# Patient Record
Sex: Female | Born: 1940 | Race: White | Hispanic: No | Marital: Married | Smoking: Never smoker
Health system: Southern US, Community
[De-identification: ages and names within clinical notes are randomized; demographics above are authoritative.]

## PROBLEM LIST (undated history)

## (undated) DIAGNOSIS — N289 Disorder of kidney and ureter, unspecified: Secondary | ICD-10-CM

## (undated) DIAGNOSIS — IMO0001 Reserved for inherently not codable concepts without codable children: Secondary | ICD-10-CM

## (undated) DIAGNOSIS — I10 Essential (primary) hypertension: Secondary | ICD-10-CM

## (undated) DIAGNOSIS — I517 Cardiomegaly: Secondary | ICD-10-CM

## (undated) DIAGNOSIS — R011 Cardiac murmur, unspecified: Secondary | ICD-10-CM

## (undated) DIAGNOSIS — I422 Other hypertrophic cardiomyopathy: Secondary | ICD-10-CM

## (undated) DIAGNOSIS — Z853 Personal history of malignant neoplasm of breast: Secondary | ICD-10-CM

## (undated) DIAGNOSIS — M1990 Unspecified osteoarthritis, unspecified site: Secondary | ICD-10-CM

## (undated) DIAGNOSIS — Z9889 Other specified postprocedural states: Secondary | ICD-10-CM

## (undated) DIAGNOSIS — R112 Nausea with vomiting, unspecified: Secondary | ICD-10-CM

## (undated) DIAGNOSIS — Z9289 Personal history of other medical treatment: Secondary | ICD-10-CM

## (undated) DIAGNOSIS — Z86711 Personal history of pulmonary embolism: Secondary | ICD-10-CM

## (undated) DIAGNOSIS — R079 Chest pain, unspecified: Secondary | ICD-10-CM

## (undated) DIAGNOSIS — R232 Flushing: Secondary | ICD-10-CM

## (undated) DIAGNOSIS — C801 Malignant (primary) neoplasm, unspecified: Secondary | ICD-10-CM

## (undated) HISTORY — DX: Personal history of malignant neoplasm of breast: Z85.3

## (undated) HISTORY — PX: OTHER SURGICAL HISTORY: SHX169

## (undated) HISTORY — PX: EYE SURGERY: SHX253

## (undated) HISTORY — DX: Cardiac murmur, unspecified: R01.1

## (undated) HISTORY — PX: MASTECTOMY: SHX3

## (undated) HISTORY — PX: TUBAL LIGATION: SHX77

## (undated) HISTORY — DX: Disorder of kidney and ureter, unspecified: N28.9

## (undated) HISTORY — DX: Chest pain, unspecified: R07.9

## (undated) HISTORY — PX: ROTATOR CUFF REPAIR: SHX139

## (undated) HISTORY — DX: Cardiomegaly: I51.7

## (undated) HISTORY — DX: Flushing: R23.2

## (undated) HISTORY — DX: Other hypertrophic cardiomyopathy: I42.2

## (undated) HISTORY — DX: Essential (primary) hypertension: I10

---

## 1998-10-18 ENCOUNTER — Encounter: Payer: Self-pay | Admitting: Obstetrics and Gynecology

## 1998-10-18 ENCOUNTER — Ambulatory Visit (HOSPITAL_COMMUNITY): Admission: RE | Admit: 1998-10-18 | Discharge: 1998-10-18 | Payer: Self-pay | Admitting: Obstetrics and Gynecology

## 1999-10-21 ENCOUNTER — Ambulatory Visit (HOSPITAL_COMMUNITY): Admission: RE | Admit: 1999-10-21 | Discharge: 1999-10-21 | Payer: Self-pay | Admitting: Obstetrics and Gynecology

## 1999-10-21 ENCOUNTER — Encounter: Payer: Self-pay | Admitting: Obstetrics and Gynecology

## 2000-03-24 ENCOUNTER — Encounter (INDEPENDENT_AMBULATORY_CARE_PROVIDER_SITE_OTHER): Payer: Self-pay | Admitting: Specialist

## 2000-03-24 ENCOUNTER — Other Ambulatory Visit: Admission: RE | Admit: 2000-03-24 | Discharge: 2000-03-24 | Payer: Self-pay | Admitting: Obstetrics and Gynecology

## 2000-03-30 ENCOUNTER — Encounter: Payer: Self-pay | Admitting: Family Medicine

## 2000-03-30 ENCOUNTER — Ambulatory Visit (HOSPITAL_COMMUNITY): Admission: RE | Admit: 2000-03-30 | Discharge: 2000-03-30 | Payer: Self-pay | Admitting: Family Medicine

## 2000-07-23 ENCOUNTER — Ambulatory Visit (HOSPITAL_COMMUNITY): Admission: RE | Admit: 2000-07-23 | Discharge: 2000-07-23 | Payer: Self-pay | Admitting: Obstetrics and Gynecology

## 2000-07-23 ENCOUNTER — Encounter (INDEPENDENT_AMBULATORY_CARE_PROVIDER_SITE_OTHER): Payer: Self-pay | Admitting: Specialist

## 2000-12-01 ENCOUNTER — Encounter: Payer: Self-pay | Admitting: Obstetrics and Gynecology

## 2000-12-01 ENCOUNTER — Ambulatory Visit (HOSPITAL_COMMUNITY): Admission: RE | Admit: 2000-12-01 | Discharge: 2000-12-01 | Payer: Self-pay | Admitting: Obstetrics and Gynecology

## 2001-12-02 ENCOUNTER — Encounter: Payer: Self-pay | Admitting: Obstetrics and Gynecology

## 2001-12-02 ENCOUNTER — Ambulatory Visit (HOSPITAL_COMMUNITY): Admission: RE | Admit: 2001-12-02 | Discharge: 2001-12-02 | Payer: Self-pay | Admitting: Obstetrics and Gynecology

## 2002-12-05 ENCOUNTER — Encounter: Payer: Self-pay | Admitting: Obstetrics and Gynecology

## 2002-12-05 ENCOUNTER — Ambulatory Visit (HOSPITAL_COMMUNITY): Admission: RE | Admit: 2002-12-05 | Discharge: 2002-12-05 | Payer: Self-pay | Admitting: Obstetrics and Gynecology

## 2003-12-06 ENCOUNTER — Ambulatory Visit (HOSPITAL_COMMUNITY): Admission: RE | Admit: 2003-12-06 | Discharge: 2003-12-06 | Payer: Self-pay | Admitting: Obstetrics and Gynecology

## 2006-07-14 ENCOUNTER — Encounter (INDEPENDENT_AMBULATORY_CARE_PROVIDER_SITE_OTHER): Payer: Self-pay | Admitting: Obstetrics and Gynecology

## 2007-09-09 IMAGING — US MAMMO-RUNI-US
1 series · 13 of 13 positions shown · non-contrast
Comparison: NONE

CLINICAL DATA: Mirek Arechiga, RT(R)(M)   Diagnostic 
Mammogram.   Lump noted in the right breast that was felt by the 
patient.  

RIGHT BREAST MAMMOGRAM AND RIGHT BREAST ULTRASOUND

[Series 1: us right breast · 0.06mm/px · 13 of 13 slices shown]
[im 1/13]
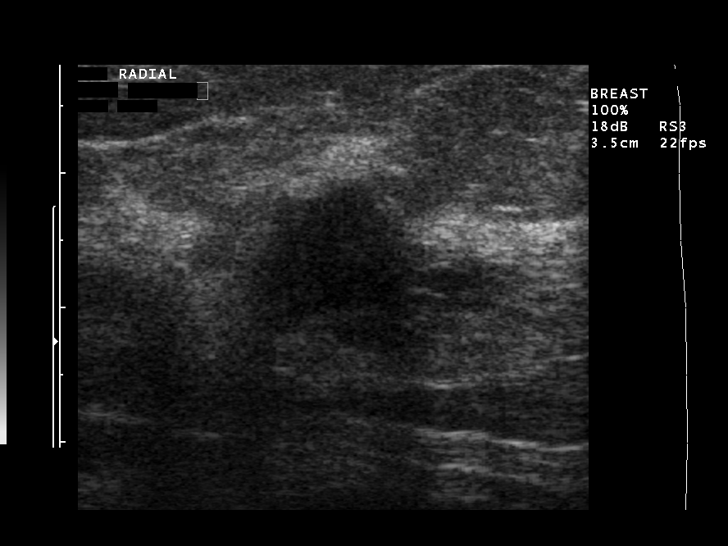
[im 2/13]
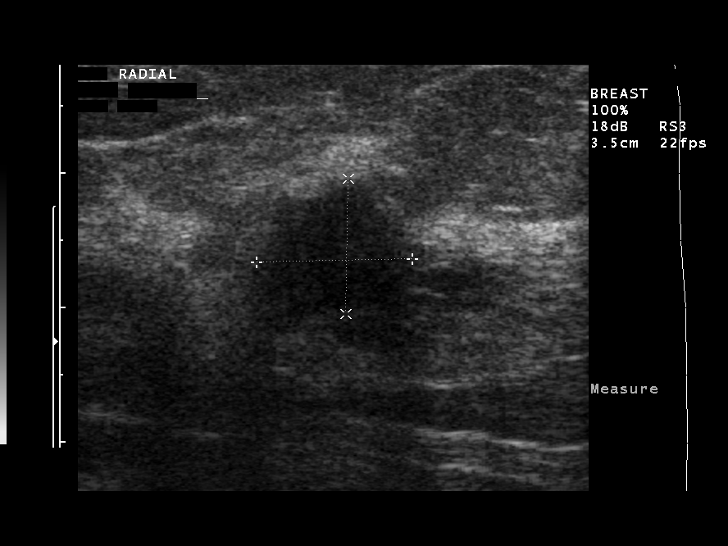
[im 3/13]
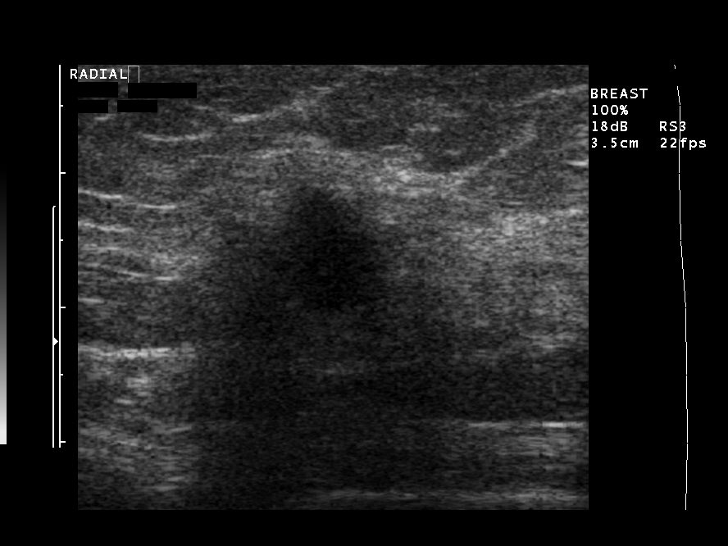
[im 4/13]
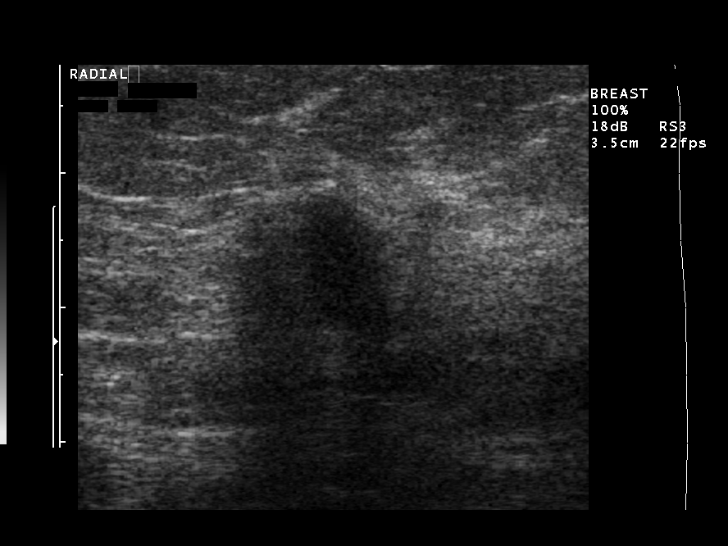
[im 5/13]
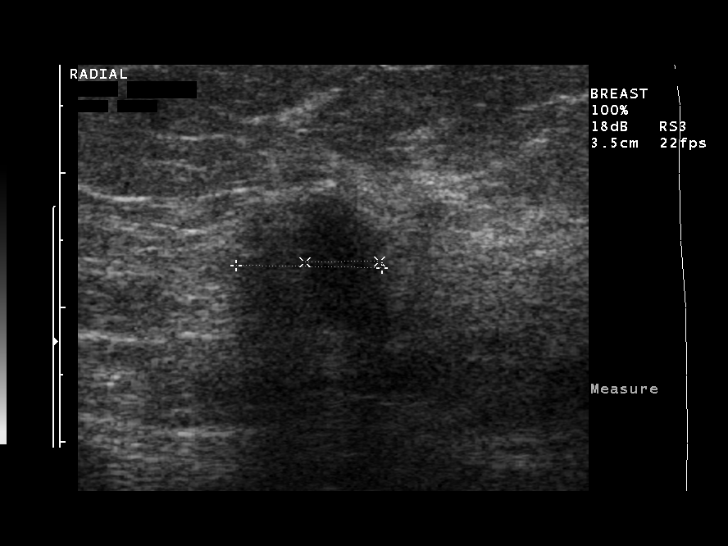
[im 6/13]
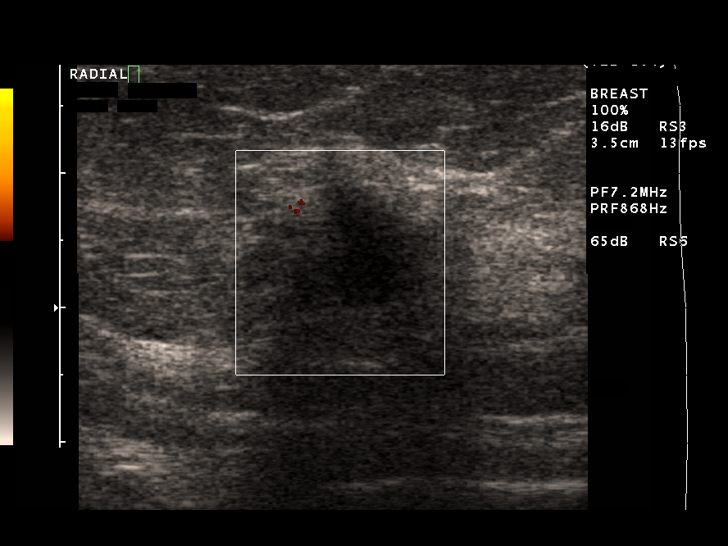
[im 7/13]
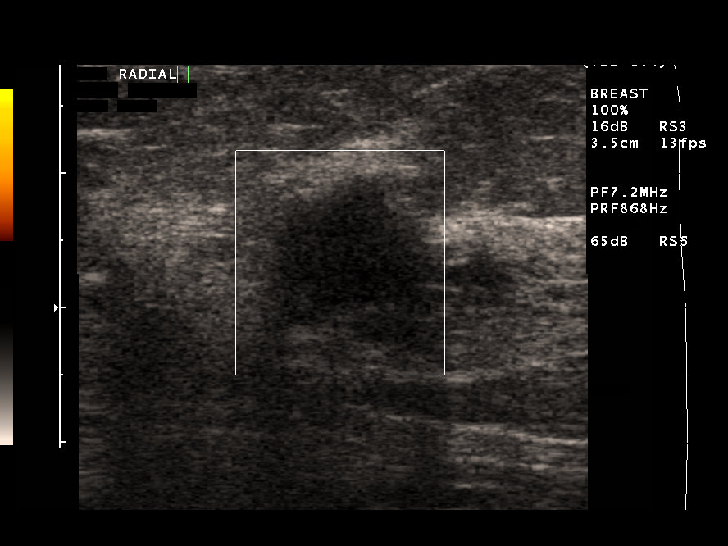
[im 8/13]
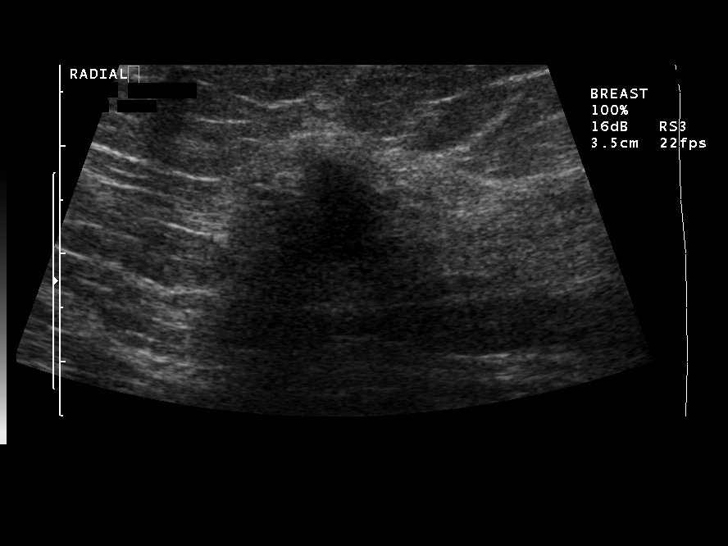
[im 9/13]
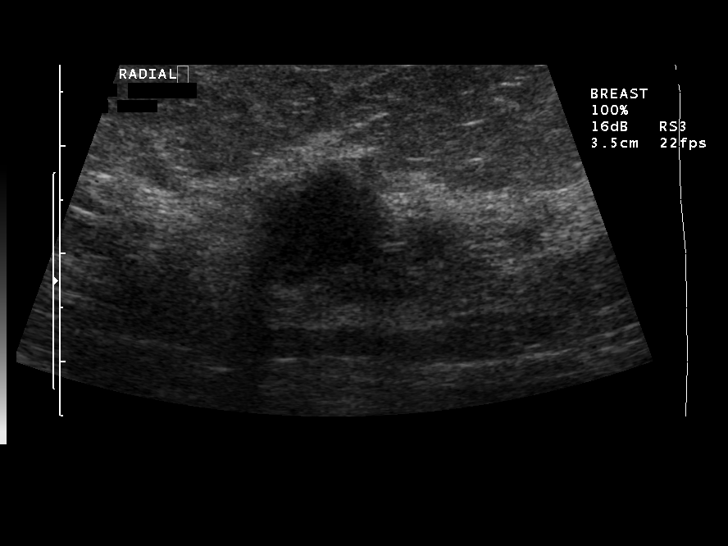
[im 10/13]
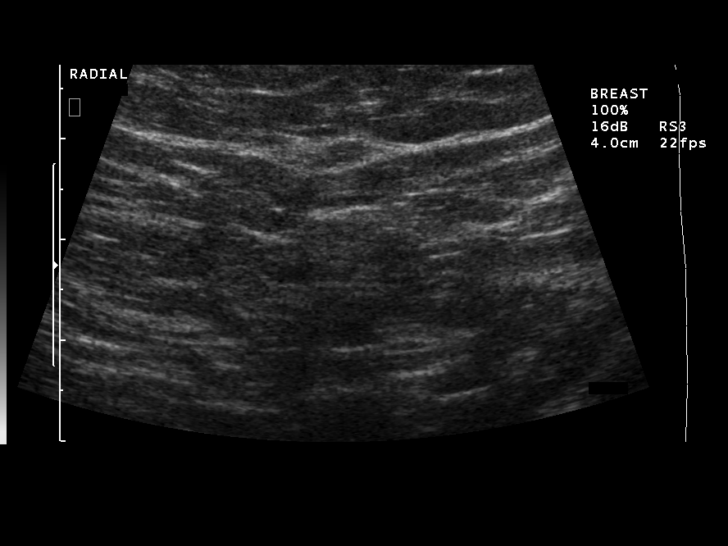
[im 11/13]
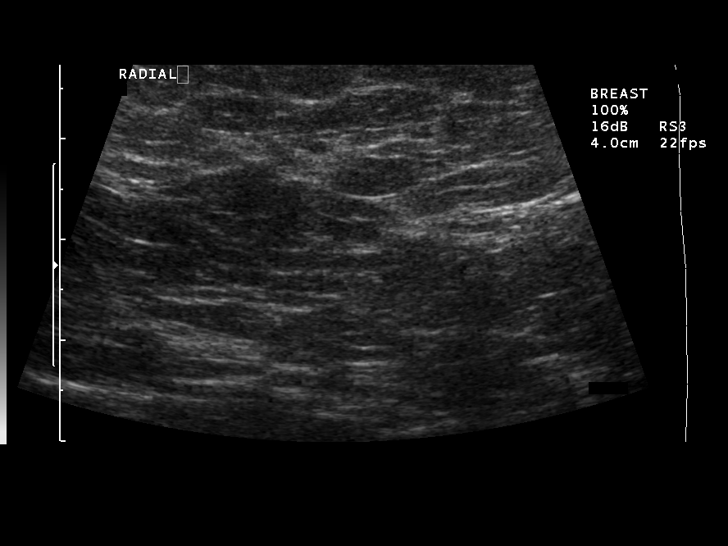
[im 12/13]
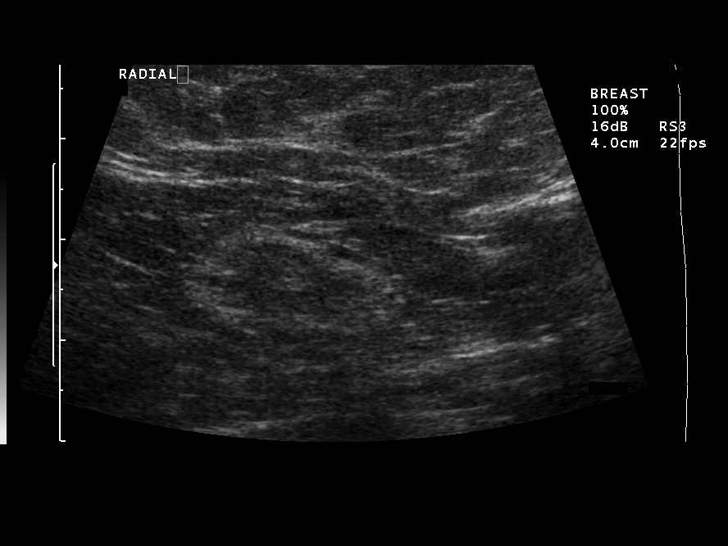
[im 13/13]
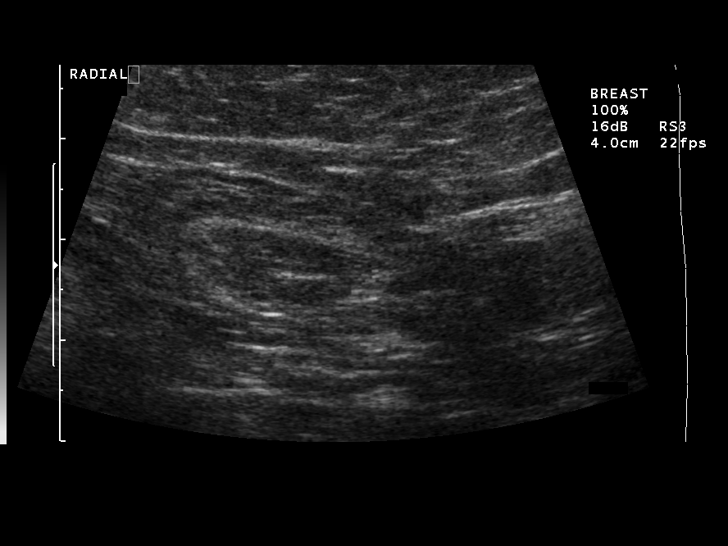

[13 of 13 positions shown; findings below may reference images not displayed]

FINDINGS: There is moderately dense breast parenchyma.  There is 
a prior mammogram dated 12-19-04 and 12-30-05 for comparison.  
There is interval development of pleomorphic cluster of 
microcalcifications in the upper outer quadrant of the breast near 
the area where the metallic marker is placed at the site of the 
lump.  No dominant mass is seen.  There are other unchanged 
calcifications noted in the right breast. At the area of the lump 
in the right breast at the [DATE] position 7 cm from the nipple, 
there is a hypoechoic mass with an irregular ill-defined margin.  
This measures approximately 12 x 10 x 11 mm.  There is associated 
acoustic shadowing.  The right axilla was evaluated by ultrasound 
and there was no evidence of an abnormal lymph node in the axilla.
IMPRESSION: Hypoechoic solid mass in the [DATE] position of the 
right breast, 7 cm from the nipple seen on ultrasound.  I would 
recommend a large core biopsy of the mass. No abnormal right 
axillary lymph nodes. Pleomorphic calcifications are seen and are 
new in the upper outer quadrant of the breast.  These are near the 
area where the patient palpates a mass.  No dominant associated 
mass could be seen on mammography. I recommend large core biopsy 
of the hypoechoic mass seen in the right breast.  I would 
recommend that a radiopaque marker be placed at the biopsy site 
and follow-up two view mammography be obtained to determine the 
relationship of the mass to the calcifications. The patient has 
been scheduled for biopsy on 07/14/06 at [DATE] a.m. The patient 
was informed at the time of the examination of the findings and 
recommendations by verbal and written lay report. Computer 
assisted (Second Look) technology was used as an aid in 
interpretation of this study. BI-RADS 5:  Highly suggestive of 
malignancy. Tiger, Abimelk electronically reviewed on 
07/10/2006 Dict Date: 07/08/2006  Tran Date: 07/10/2006 THINGAHANGWI OZA

## 2009-10-09 ENCOUNTER — Ambulatory Visit: Payer: Self-pay | Admitting: Cardiovascular Disease

## 2010-04-24 ENCOUNTER — Ambulatory Visit (INDEPENDENT_AMBULATORY_CARE_PROVIDER_SITE_OTHER): Payer: Medicare Other | Admitting: Cardiovascular Disease

## 2010-04-24 DIAGNOSIS — I119 Hypertensive heart disease without heart failure: Secondary | ICD-10-CM

## 2010-06-28 NOTE — Op Note (Signed)
[facility]  Patient:    Sarah Williamson, Sarah Williamson                        MRN: [id number] Proc. Date: 07/23/00 Adm. Date:  [id number] Attending:  Lendon Colonel                           Operative Report  PREOPERATIVE DIAGNOSIS:  Postmenopausal bleeding, persistent.  POSTOPERATIVE DIAGNOSES:  Postmenopausal bleeding, persistent, submucous fibroid.  OPERATION:  Hysteroscopy with resection of submucous fibroid.  DESCRIPTION OF PROCEDURE:  The patient was placed in lithotomy position and prepped and draped in the usual fashion.  Cervix dilated.  Endometrial cavity was visualized.  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  The remaining portion of the uterus was resected as well.  Coagulation was obtained for hemostasis as well.  No unusual blood loss occurred.  All of the resection was sent to the lab for study.  Ms. Olivia Mackie tolerated the procedure well and was sent to the recovery room in good condition. DD:  07/23/00 TD:  07/23/00 Job: [id number]

## 2010-07-19 ENCOUNTER — Other Ambulatory Visit: Payer: Self-pay | Admitting: Cardiovascular Disease

## 2010-07-22 ENCOUNTER — Other Ambulatory Visit: Payer: Self-pay | Admitting: *Deleted

## 2010-07-22 MED ORDER — HYDROCHLOROTHIAZIDE 25 MG PO TABS: 25.0000 mg | ORAL_TABLET | Freq: Every day | ORAL | Status: DC

## 2010-07-22 NOTE — Telephone Encounter (Signed)
Fax received from pharmacy. Refill completed. Jodette Razan Siler RN  

## 2011-01-17 ENCOUNTER — Other Ambulatory Visit: Payer: Self-pay | Admitting: Cardiovascular Disease

## 2011-01-17 NOTE — Telephone Encounter (Signed)
Fax Received. Refill Completed. Sarah Williamson (R.M.A)   

## 2011-03-03 ENCOUNTER — Other Ambulatory Visit: Payer: Self-pay | Admitting: *Deleted

## 2011-03-03 MED ORDER — HYDROCHLOROTHIAZIDE 25 MG PO TABS: 25.0000 mg | ORAL_TABLET | Freq: Every day | ORAL | Status: DC

## 2011-03-03 MED ORDER — METOPROLOL SUCCINATE ER 50 MG PO TB24: 50.0000 mg | ORAL_TABLET | Freq: Every day | ORAL | Status: DC

## 2011-03-10 ENCOUNTER — Other Ambulatory Visit: Payer: Self-pay | Admitting: *Deleted

## 2011-07-06 ENCOUNTER — Encounter: Payer: Self-pay | Admitting: *Deleted

## 2011-07-14 ENCOUNTER — Telehealth: Payer: Self-pay | Admitting: Cardiovascular Disease

## 2011-07-14 DIAGNOSIS — R011 Cardiac murmur, unspecified: Secondary | ICD-10-CM | POA: Insufficient documentation

## 2011-07-14 MED ORDER — LISINOPRIL 40 MG PO TABS: 40.0000 mg | ORAL_TABLET | Freq: Every day | ORAL | Status: DC

## 2011-07-14 NOTE — Telephone Encounter (Signed)
Pt not seen in over a year/ HX: murmur, app made, pt on lisinopril/ order completed.

## 2011-07-14 NOTE — Telephone Encounter (Signed)
Did not see where patient was lisinopril, could you please look at this. Thanks  Okey Dupre

## 2011-07-14 NOTE — Telephone Encounter (Signed)
New Problem:    Patient called in needing a refill of her lisinopril.  Please call back.

## 2011-08-20 ENCOUNTER — Encounter: Payer: Self-pay | Admitting: Cardiovascular Disease

## 2011-08-20 ENCOUNTER — Ambulatory Visit (INDEPENDENT_AMBULATORY_CARE_PROVIDER_SITE_OTHER): Payer: Medicare Other | Admitting: Cardiovascular Disease

## 2011-08-20 VITALS — BP 158/88 | HR 80 | Ht <= 58 in | Wt 150.8 lb

## 2011-08-20 DIAGNOSIS — I421 Obstructive hypertrophic cardiomyopathy: Secondary | ICD-10-CM

## 2011-08-20 DIAGNOSIS — R011 Cardiac murmur, unspecified: Secondary | ICD-10-CM

## 2011-08-20 DIAGNOSIS — Q248 Other specified congenital malformations of heart: Secondary | ICD-10-CM | POA: Insufficient documentation

## 2011-08-20 DIAGNOSIS — I10 Essential (primary) hypertension: Secondary | ICD-10-CM

## 2011-08-20 MED ORDER — LISINOPRIL 40 MG PO TABS: 40.0000 mg | ORAL_TABLET | Freq: Every day | ORAL | Status: DC

## 2011-08-20 MED ORDER — METOPROLOL SUCCINATE ER 100 MG PO TB24: 100.0000 mg | ORAL_TABLET | Freq: Every day | ORAL | Status: DC

## 2011-08-20 NOTE — Assessment & Plan Note (Signed)
Has a history of mild hypertension and also has a mild left ventricular outflow tract obstruction. 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  I suspect that she is becoming lightheaded and presyncopal when she becomes volume depleted. This is being exacerbated by her HCTZ. She knows that she does not drink a lot.  We'll try to increase her metoprolol up to 100 mg a day. We'll stop her HCTZ to see if that helps. I told her to avoid eating any extra salt. I'll see her back in 3 months for followup office visit as well as basic metabolic profile.

## 2011-08-20 NOTE — Progress Notes (Signed)
Sarah Williamson Date of Birth  1940-08-27       [facility] Office [address]  [address] [place], [place]  [address]   [place], [place]  [address] [phone]     [phone]   Fax  [phone]    Fax [phone]  Problem List: 1. Hypertension 2. 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 History breast cancer 4. Spinal stenosis  History of Present Illness:  Sarah Williamson is doing well,  She just got back from a long vacation.  She had some severe dyspnea when she was at [facility].  She also visited the [place] and [address].  She has occasional episodes where she feels weak and dizzy. He is predominately occur when she is volume depleted. She has a mild left ventricular outflow tract obstruction that I suspect worsens if she becomes dehydrated.  Current Outpatient Prescriptions on File Prior to Visit  Medication Sig Dispense Refill  . Calcium Carbonate-Vitamin D (CALCIUM + D PO) Take 1,200 mg by mouth daily.      . hydrochlorothiazide (HYDRODIURIL) 25 MG tablet Take 1 tablet (25 mg total) by mouth daily.  90 tablet  2  . metoprolol succinate (TOPROL-XL) 50 MG 24 hr tablet Take 1 tablet (50 mg total) by mouth daily. Take with or immediately following a meal.  90 tablet  2  . DISCONTD: lisinopril (PRINIVIL,ZESTRIL) 40 MG tablet Take 1 tablet (40 mg total) by mouth daily.  90 tablet  0    Allergies  Allergen Reactions  . Penicillins   . Sulfa Antibiotics     INTOLERANT    Past Medical History  Diagnosis Date  . Hypertension   . Heart murmur   . Asymmetric septal hypertrophy     WITH MILD LVOT OBSTRUCTION  . History of breast cancer   . 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 Chest pain, unspecified   . Hot flashes     Past Surgical History  Procedure Date  . Btl     AND 2 MISCARRIAGES  . Mastectomy     History  Smoking status  . Never Smoker   Smokeless tobacco    . Not on file    History  Alcohol Use: Not on file    Family History  Problem Relation Age of Onset  . Hypertension Mother   . Heart failure Father   . Heart attack Father   . Diabetes Maternal Grandmother   . Diabetes Maternal Grandfather   . Heart attack Paternal Grandmother     Reviw of Systems:  Reviewed in the HPI.  All other systems are negative.  Physical Exam: Blood pressure 158/88, pulse 80, height 4\' 8"  (1.422 m), weight 150 lb 12.8 oz (68.402 kg). General: Well developed, well nourished, in no acute distress.  Head: Normocephalic, atraumatic, sclera non-icteric, mucus membranes are moist,   Neck: Supple. Carotids are 2 + without bruits. No JVD  Lungs: Clear bilaterally to auscultation.  Heart: regular rate.  normal  S1 S2. She is a very soft systolic murmur  Abdomen: Soft, non-tender, non-distended with normal bowel sounds. No hepatomegaly. No rebound/guarding. No masses.  Msk:  Strength and tone are normal  Extremities: No clubbing or cyanosis. trace edema - left > right.  Distal pedal pulses are 2+ and equal bilaterally.  Neuro: Alert and oriented X 3. Moves all extremities spontaneously.  Psych:  Responds to questions  appropriately with a normal affect.  ECG: 08/20/2011-normal sinus rhythm at 80 beats a minute. She has no ST or T wave changes.  Assessment / Plan:

## 2011-08-20 NOTE — Patient Instructions (Addendum)
Your physician recommends that you schedule a follow-up appointment in: 3 MONTHS   Your physician has recommended you make the following change in your medication:   STOP HCTZ  increase METOPROLOL TO 100 MG DAILY

## 2011-11-24 ENCOUNTER — Encounter: Payer: Self-pay | Admitting: Cardiovascular Disease

## 2011-11-24 ENCOUNTER — Ambulatory Visit (INDEPENDENT_AMBULATORY_CARE_PROVIDER_SITE_OTHER): Payer: Medicare Other | Admitting: Cardiovascular Disease

## 2011-11-24 VITALS — BP 158/82 | HR 76 | Ht <= 58 in | Wt 152.8 lb

## 2011-11-24 DIAGNOSIS — I421 Obstructive hypertrophic cardiomyopathy: Secondary | ICD-10-CM

## 2011-11-24 MED ORDER — HYDROCHLOROTHIAZIDE 25 MG PO TABS: 12.5000 mg | ORAL_TABLET | Freq: Every day | ORAL | Status: DC

## 2011-11-24 NOTE — Assessment & Plan Note (Signed)
Sarah Williamson presents for followup visit. She has a very mild LVOT obstruction. She is doing very well on the HCTZ 12.5 mg a day.  I have asked her to work on a good diet, exercise size, and weight loss program. She is to avoid eating a lot of salt. I'll see her again in one year for followup visit and fasting labs.

## 2011-11-24 NOTE — Progress Notes (Signed)
Sarah Williamson Date of Birth  Nov 06, 1940       [facility] Office [address]  [address] [place], [place]  [address]   [place], [place]  [address] [phone]     [phone]   Fax  [phone]    Fax [phone]  Problem List: 1. Hypertension 2. 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 History breast cancer 4. Spinal stenosis  History of Present Illness:  Sarah Williamson is doing well,  We  decreased her HCTZ and she  is doing well.  She still does not get much exercise.  Current Outpatient Prescriptions on File Prior to Visit  Medication Sig Dispense Refill  . aspirin 81 MG tablet Take 81 mg by mouth daily.      Marland Kitchen lisinopril (PRINIVIL,ZESTRIL) 40 MG tablet Take 1 tablet (40 mg total) by mouth daily.  90 tablet  3  . meloxicam (MOBIC) 7.5 MG tablet Take 7.5 mg by mouth daily.      . metoprolol succinate (TOPROL-XL) 100 MG 24 hr tablet Take 1 tablet (100 mg total) by mouth daily. Take with or immediately following a meal.  90 tablet  2  . Multiple Vitamins-Minerals (MULTIVITAMIN PO) Take by mouth.      . traMADol (ULTRAM) 50 MG tablet Take 50 mg by mouth every 6 (six) hours as needed.      Marland Kitchen DISCONTD: hydrochlorothiazide (HYDRODIURIL) 25 MG tablet Take 1 tablet (25 mg total) by mouth daily.  90 tablet  2    Allergies  Allergen Reactions  . Penicillins   . Sulfa Antibiotics     INTOLERANT    Past Medical History  Diagnosis Date  . Hypertension   . Heart murmur   . Asymmetric septal hypertrophy     WITH MILD LVOT OBSTRUCTION  . History of breast cancer   . 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 Chest pain, unspecified   . Hot flashes     Past Surgical History  Procedure Date  . Btl     AND 2 MISCARRIAGES  . Mastectomy     History  Smoking status  . Never Smoker   Smokeless tobacco  . Not on file    History  Alcohol Use: Not on file    Family History  Problem Relation  Age of Onset  . Hypertension Mother   . Heart failure Father   . Heart attack Father   . Diabetes Maternal Grandmother   . Diabetes Maternal Grandfather   . Heart attack Paternal Grandmother     Reviw of Systems:  Reviewed in the HPI.  All other systems are negative.  Physical Exam: Blood pressure 158/82, pulse 76, height 4\' 8"  (1.422 m), weight 152 lb 12.8 oz (69.31 kg), SpO2 97.00%. General: Well developed, well nourished, in no acute distress.    Head: Normocephalic, atraumatic, sclera non-icteric, mucus membranes are moist,   Neck: Supple. Carotids are 2 + without bruits. No JVD  Lungs: Clear bilaterally to auscultation.  Heart: regular rate.  normal  S1 S2. She is a very soft systolic murmur  Abdomen: Soft, non-tender, non-distended with normal bowel sounds. No hepatomegaly. No rebound/guarding. No masses.  Msk:  Strength and tone are normal  Extremities: No clubbing or cyanosis. trace edema .  Marland Kitchen  Distal pedal pulses are 2+ and equal bilaterally.  Neuro: Alert and oriented X 3. Moves all extremities spontaneously.  Psych:  Responds to questions appropriately with a normal affect.  ECG:  Assessment / Plan:

## 2011-11-24 NOTE — Patient Instructions (Addendum)
Your physician wants you to follow-up in: 1 year  You will receive a reminder letter in the mail two months in advance. If you don't receive a letter, please call our office to schedule the follow-up appointment.  Your physician recommends that you continue on your current medications as directed. Please refer to the Current Medication list given to you today.  

## 2011-12-02 ENCOUNTER — Encounter: Payer: Self-pay | Admitting: Cardiovascular Disease

## 2011-12-25 ENCOUNTER — Encounter: Payer: Self-pay | Admitting: Cardiovascular Disease

## 2012-06-11 ENCOUNTER — Other Ambulatory Visit: Payer: Self-pay | Admitting: *Deleted

## 2012-06-11 MED ORDER — METOPROLOL SUCCINATE ER 100 MG PO TB24: 100.0000 mg | ORAL_TABLET | Freq: Every day | ORAL | Status: DC

## 2012-10-06 ENCOUNTER — Other Ambulatory Visit: Payer: Self-pay

## 2012-10-06 MED ORDER — LISINOPRIL 40 MG PO TABS: 40.0000 mg | ORAL_TABLET | Freq: Every day | ORAL | Status: DC

## 2012-11-12 ENCOUNTER — Encounter: Payer: Self-pay | Admitting: Cardiovascular Disease

## 2012-11-23 ENCOUNTER — Ambulatory Visit (INDEPENDENT_AMBULATORY_CARE_PROVIDER_SITE_OTHER): Payer: Medicare Other | Admitting: Cardiovascular Disease

## 2012-11-23 ENCOUNTER — Encounter: Payer: Self-pay | Admitting: Cardiovascular Disease

## 2012-11-23 VITALS — BP 158/82 | HR 71 | Ht <= 58 in | Wt 158.0 lb

## 2012-11-23 DIAGNOSIS — I10 Essential (primary) hypertension: Secondary | ICD-10-CM

## 2012-11-23 DIAGNOSIS — R011 Cardiac murmur, unspecified: Secondary | ICD-10-CM

## 2012-11-23 LAB — HEPATIC FUNCTION PANEL
Albumin: 4.1 g/dL (ref 3.5–5.2)
Alkaline Phosphatase: 98 U/L (ref 39–117)
Total Bilirubin: 0.8 mg/dL (ref 0.3–1.2)
Total Protein: 6.7 g/dL (ref 6.0–8.3)

## 2012-11-23 LAB — LIPID PANEL
Cholesterol: 143 mg/dL (ref 0–200)
HDL: 56.1 mg/dL (ref >39.00)
LDL Cholesterol: 67 mg/dL (ref 0–99)
Total CHOL/HDL Ratio: 3
Triglycerides: 100 mg/dL (ref 0.0–149.0)
VLDL: 20 mg/dL (ref 0.0–40.0)

## 2012-11-23 LAB — BASIC METABOLIC PANEL
Chloride: 104 mEq/L (ref 96–112)
Glucose, Bld: 96 mg/dL (ref 70–99)
Potassium: 5.3 mEq/L — ABNORMAL HIGH (ref 3.5–5.1)
Sodium: 139 mEq/L (ref 135–145)

## 2012-11-23 MED ORDER — HYDROCHLOROTHIAZIDE 25 MG PO TABS: 12.5000 mg | ORAL_TABLET | Freq: Every day | ORAL | Status: DC

## 2012-11-23 MED ORDER — METOPROLOL SUCCINATE ER 100 MG PO TB24: 100.0000 mg | ORAL_TABLET | Freq: Every day | ORAL | Status: DC

## 2012-11-23 MED ORDER — LISINOPRIL 40 MG PO TABS: 40.0000 mg | ORAL_TABLET | Freq: Every day | ORAL | Status: DC

## 2012-11-23 NOTE — Progress Notes (Signed)
Verneita Griffes Date of Birth  1941-02-05       [facility] Office [address]  [address] [place], [place]  [address]   [place], [place]  [address] [phone]     [phone]   Fax  [phone]    Fax [phone]  Problem List: 1. Hypertension 2. 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 History breast cancer 4. Spinal stenosis  History of Present Illness:  Gerrica is doing well,  We  decreased her HCTZ and she  is doing well.  She still does not get much exercise.  Oct. 14, 2014:  Elika is doing better.  She had right rotator cuff surgery and has been in rehab for most of the summer.   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    No dyspnea.    She takes her bp at home and typically get 130/ 82.    Current Outpatient Prescriptions on File Prior to Visit  Medication Sig Dispense Refill  . aspirin 81 MG tablet Take 81 mg by mouth daily.      . hydrochlorothiazide (HYDRODIURIL) 25 MG tablet Take 0.5 tablets (12.5 mg total) by mouth daily.  15 tablet  11  . lisinopril (PRINIVIL,ZESTRIL) 40 MG tablet Take 1 tablet (40 mg total) by mouth daily.  90 tablet  0  . meloxicam (MOBIC) 7.5 MG tablet Take 7.5 mg by mouth daily.      . metoprolol succinate (TOPROL-XL) 100 MG 24 hr tablet Take 1 tablet (100 mg total) by mouth daily. Take with or immediately following a meal.  90 tablet  2  . Multiple Vitamins-Minerals (MULTIVITAMIN PO) Take by mouth.      . traMADol (ULTRAM) 50 MG tablet Take 50 mg by mouth every 6 (six) hours as needed.       No current facility-administered medications on file prior to visit.    Allergies  Allergen Reactions  . Penicillins   . Sulfa Antibiotics     INTOLERANT    Past Medical History  Diagnosis Date  . Hypertension   . Heart murmur   . Asymmetric septal hypertrophy     WITH MILD LVOT OBSTRUCTION  . History of breast cancer   . Renal  insufficiency     TRANSIENT- PROBABLY RESOLVED  . Chest pain, unspecified   . Hot flashes     Past Surgical History  Procedure Laterality Date  . Btl      AND 2 MISCARRIAGES  . Mastectomy      History  Smoking status  . Never Smoker   Smokeless tobacco  . Not on file    History  Alcohol Use No    Family History  Problem Relation Age of Onset  . Hypertension Mother   . Heart failure Father   . Heart attack Father   . Diabetes Maternal Grandmother   . Diabetes Maternal Grandfather   . Heart attack Paternal Grandmother     Reviw of Systems:  Reviewed in the HPI.  All other systems are negative.  Physical Exam: Blood pressure 158/82, pulse 71, height 4\' 8"  (1.422 m), weight 158 lb (71.668 kg). General: Well developed, well nourished, in no acute distress.    Head: Normocephalic, atraumatic, sclera non-icteric, mucus membranes are moist,   Neck: Supple. Carotids are 2 + without bruits. No JVD  Lungs: Clear bilaterally to auscultation.  Heart:  regular rate.  normal  S1 S2. She is a very soft systolic murmur  Abdomen: Soft, non-tender, non-distended with normal bowel sounds. No hepatomegaly. No rebound/guarding. No masses.  Msk:  Strength and tone are normal  Extremities: No clubbing or cyanosis. trace edema .   Distal pedal pulses are 2+ and equal bilaterally.  Neuro: Alert and oriented X 3. Moves all extremities spontaneously.  Psych:  Responds to questions appropriately with a normal affect.  ECG: Oct. 14 ,2014:  Sinus rhythm with 1st degree AV block, otherwise normal ECG.   Assessment / Plan:

## 2012-11-23 NOTE — Assessment & Plan Note (Signed)
Ardene. seems to be doing well.  I will continue with her same meds.  Her systolic murmur is barely audible.  She is not able to exercise.  I will check labs and will see her again in 1 year with fasting lipids, liver, BMP.

## 2012-11-23 NOTE — Patient Instructions (Signed)
Your physician wants you to follow-up in:1 YEAR  You will receive a reminder letter in the mail two months in advance. If you don't receive a letter, please call our office to schedule the follow-up appointment.   Your physician recommends that you return for a FASTING lipid profile: TODAY

## 2013-02-10 DIAGNOSIS — Z86711 Personal history of pulmonary embolism: Secondary | ICD-10-CM

## 2013-02-10 HISTORY — DX: Personal history of pulmonary embolism: Z86.711

## 2013-05-23 ENCOUNTER — Emergency Department (HOSPITAL_BASED_OUTPATIENT_CLINIC_OR_DEPARTMENT_OTHER): Payer: Medicare Other

## 2013-05-23 ENCOUNTER — Inpatient Hospital Stay (HOSPITAL_BASED_OUTPATIENT_CLINIC_OR_DEPARTMENT_OTHER)
Admission: EM | Admit: 2013-05-23 | Discharge: 2013-05-24 | DRG: 176 | Disposition: A | Discharge status: Home / Self Care | Payer: Medicare Other | Attending: Internal Medicine | Admitting: Internal Medicine

## 2013-05-23 ENCOUNTER — Encounter (HOSPITAL_BASED_OUTPATIENT_CLINIC_OR_DEPARTMENT_OTHER): Payer: Self-pay | Admitting: Emergency Medicine

## 2013-05-23 DIAGNOSIS — I10 Essential (primary) hypertension: Secondary | ICD-10-CM

## 2013-05-23 DIAGNOSIS — Q248 Other specified congenital malformations of heart: Secondary | ICD-10-CM

## 2013-05-23 DIAGNOSIS — Z86711 Personal history of pulmonary embolism: Secondary | ICD-10-CM

## 2013-05-23 DIAGNOSIS — R011 Cardiac murmur, unspecified: Secondary | ICD-10-CM

## 2013-05-23 DIAGNOSIS — Z7982 Long term (current) use of aspirin: Secondary | ICD-10-CM

## 2013-05-23 DIAGNOSIS — I2699 Other pulmonary embolism without acute cor pulmonale: Principal | ICD-10-CM | POA: Diagnosis present

## 2013-05-23 DIAGNOSIS — Z853 Personal history of malignant neoplasm of breast: Secondary | ICD-10-CM

## 2013-05-23 HISTORY — DX: Malignant (primary) neoplasm, unspecified: C80.1

## 2013-05-23 LAB — CBC WITH DIFFERENTIAL/PLATELET
BASOS ABS: 0 10*3/uL (ref 0.0–0.1)
BASOS PCT: 0 % (ref 0–1)
EOS PCT: 4 % (ref 0–5)
Eosinophils Absolute: 0.4 10*3/uL (ref 0.0–0.7)
HCT: 44.3 % (ref 36.0–46.0)
HEMOGLOBIN: 14.7 g/dL (ref 12.0–15.0)
LYMPHS PCT: 16 % (ref 12–46)
Lymphs Abs: 1.4 10*3/uL (ref 0.7–4.0)
MCH: 32.2 pg (ref 26.0–34.0)
MCHC: 33.2 g/dL (ref 30.0–36.0)
MCV: 97.1 fL (ref 78.0–100.0)
MONO ABS: 0.7 10*3/uL (ref 0.1–1.0)
Monocytes Relative: 8 % (ref 3–12)
NEUTROS ABS: 6.4 10*3/uL (ref 1.7–7.7)
NEUTROS PCT: 72 % (ref 43–77)
Platelets: 202 10*3/uL (ref 150–400)
RBC: 4.56 MIL/uL (ref 3.87–5.11)
RDW: 13.8 % (ref 11.5–15.5)
WBC: 9 10*3/uL (ref 4.0–10.5)

## 2013-05-23 LAB — COMPREHENSIVE METABOLIC PANEL
ALBUMIN: 3.8 g/dL (ref 3.5–5.2)
ALT: 21 U/L (ref 0–35)
AST: 27 U/L (ref 0–37)
Alkaline Phosphatase: 119 U/L — ABNORMAL HIGH (ref 39–117)
BUN: 22 mg/dL (ref 6–23)
CALCIUM: 10 mg/dL (ref 8.4–10.5)
CO2: 28 mEq/L (ref 19–32)
Chloride: 105 mEq/L (ref 96–112)
Creatinine, Ser: 1.2 mg/dL — ABNORMAL HIGH (ref 0.50–1.10)
GFR calc Af Amer: 51 mL/min — ABNORMAL LOW (ref >90)
GFR calc non Af Amer: 44 mL/min — ABNORMAL LOW (ref >90)
Glucose, Bld: 127 mg/dL — ABNORMAL HIGH (ref 70–99)
Potassium: 4 mEq/L (ref 3.7–5.3)
SODIUM: 145 meq/L (ref 137–147)
Total Bilirubin: 0.4 mg/dL (ref 0.3–1.2)
Total Protein: 6.6 g/dL (ref 6.0–8.3)

## 2013-05-23 LAB — PROTIME-INR
INR: 0.93 (ref 0.00–1.49)
PROTHROMBIN TIME: 12.3 s (ref 11.6–15.2)

## 2013-05-23 LAB — PRO B NATRIURETIC PEPTIDE: PRO B NATRI PEPTIDE: 680.5 pg/mL — AB (ref 0–125)

## 2013-05-23 LAB — TROPONIN I: Troponin I: <0.3 ng/mL (ref <0.30)

## 2013-05-23 LAB — D-DIMER, QUANTITATIVE (NOT AT ARMC): D-Dimer, Quant: 1.22 ug/mL-FEU — ABNORMAL HIGH (ref 0.00–0.48)

## 2013-05-23 MED ORDER — ONDANSETRON HCL 4 MG PO TABS: 4.0000 mg | ORAL_TABLET | Freq: Four times a day (QID) | ORAL | Status: DC | PRN

## 2013-05-23 MED ORDER — ONDANSETRON HCL 4 MG/2ML IJ SOLN: 4.0000 mg | Freq: Four times a day (QID) | INTRAMUSCULAR | Status: DC | PRN

## 2013-05-23 MED ORDER — HYDRALAZINE HCL 20 MG/ML IJ SOLN: 5.0000 mg | Freq: Four times a day (QID) | INTRAMUSCULAR | Status: DC | PRN

## 2013-05-23 MED ORDER — METOPROLOL TARTRATE 1 MG/ML IV SOLN
5.0000 mg | Freq: Once | INTRAVENOUS | Status: AC
  Administered 2013-05-23: 5 mg via INTRAVENOUS
  Filled 2013-05-23: qty 5

## 2013-05-23 MED ORDER — LISINOPRIL 40 MG PO TABS
40.0000 mg | ORAL_TABLET | Freq: Every day | ORAL | Status: DC
  Administered 2013-05-24: 40 mg via ORAL
  Filled 2013-05-23: qty 1

## 2013-05-23 MED ORDER — ACETAMINOPHEN 650 MG RE SUPP: 650.0000 mg | Freq: Four times a day (QID) | RECTAL | Status: DC | PRN

## 2013-05-23 MED ORDER — ENOXAPARIN SODIUM 80 MG/0.8ML ~~LOC~~ SOLN
70.0000 mg | Freq: Once | SUBCUTANEOUS | Status: AC
  Administered 2013-05-23: 70 mg via SUBCUTANEOUS
  Filled 2013-05-23: qty 0.8

## 2013-05-23 MED ORDER — ACETAMINOPHEN 325 MG PO TABS: 650.0000 mg | ORAL_TABLET | Freq: Four times a day (QID) | ORAL | Status: DC | PRN

## 2013-05-23 MED ORDER — SODIUM CHLORIDE 0.9 % IJ SOLN: 3.0000 mL | Freq: Two times a day (BID) | INTRAMUSCULAR | Status: DC

## 2013-05-23 MED ORDER — SODIUM CHLORIDE 0.9 % IJ SOLN
3.0000 mL | Freq: Two times a day (BID) | INTRAMUSCULAR | Status: DC
  Administered 2013-05-23 – 2013-05-24 (×2): 3 mL via INTRAVENOUS

## 2013-05-23 MED ORDER — TRAMADOL HCL 50 MG PO TABS: 50.0000 mg | ORAL_TABLET | Freq: Four times a day (QID) | ORAL | Status: DC | PRN

## 2013-05-23 MED ORDER — IOHEXOL 350 MG/ML SOLN
100.0000 mL | Freq: Once | INTRAVENOUS | Status: AC | PRN
  Administered 2013-05-23: 100 mL via INTRAVENOUS

## 2013-05-23 MED ORDER — ENOXAPARIN SODIUM 80 MG/0.8ML ~~LOC~~ SOLN
70.0000 mg | Freq: Two times a day (BID) | SUBCUTANEOUS | Status: DC
  Administered 2013-05-24: 70 mg via SUBCUTANEOUS
  Filled 2013-05-23 (×3): qty 0.8

## 2013-05-23 MED ORDER — SODIUM CHLORIDE 0.9 % IV BOLUS (SEPSIS)
500.0000 mL | Freq: Once | INTRAVENOUS | Status: AC
  Administered 2013-05-23: 16:00:00 via INTRAVENOUS

## 2013-05-23 MED ORDER — METOPROLOL SUCCINATE ER 100 MG PO TB24
100.0000 mg | ORAL_TABLET | Freq: Every day | ORAL | Status: DC
  Administered 2013-05-24: 100 mg via ORAL
  Filled 2013-05-23: qty 1

## 2013-05-23 NOTE — ED Notes (Signed)
Pt placed on heart monitor.

## 2013-05-23 NOTE — ED Provider Notes (Addendum)
CSN: [id number]     Arrival date & time 05/23/13  1318 History   First MD Initiated Contact with Patient 05/23/13 1327     Chief Complaint  Patient presents with  . Shortness of Breath     (Consider location/radiation/quality/duration/timing/severity/associated sxs/prior Treatment) HPI 73 year old female history of breast cancer presents today with dyspnea on awakening. She states that she has felt short of breath with exertion. She has not had fever, cough, or chest pain  she has ongoing peripheral edema with left worse than right which she states is at her baseline. She has not noted any increased weight. She has had a normal appetite and has eaten breakfast and lunch. She is followed by Dr. 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 She denies history of DVT or pulmonary embolism. She has not been immobilized recently. Past Medical History  Diagnosis Date  . Hypertension   . Heart murmur   . Asymmetric septal hypertrophy     WITH MILD LVOT OBSTRUCTION  . History of breast cancer   . 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 Chest pain, unspecified   . Hot flashes   . Cancer    Past Surgical History  Procedure Laterality Date  . Btl      AND 2 MISCARRIAGES  . Mastectomy     Family History  Problem Relation Age of Onset  . Hypertension Mother   . Heart failure Father   . Heart attack Father   . Diabetes Maternal Grandmother   . Diabetes Maternal Grandfather   . Heart attack Paternal Grandmother    History  Substance Use Topics  . Smoking status: Never Smoker   . Smokeless tobacco: Not on file  . Alcohol Use: No   OB History   Grav Para Term Preterm Abortions TAB SAB Ect Mult Living                 Review of Systems  All other systems reviewed and are negative.     Allergies  Penicillins and Sulfa antibiotics  Home Medications   Current Outpatient Rx  Name  Route  Sig  Dispense  Refill  .  aspirin 81 MG tablet   Oral   Take 81 mg by mouth daily.         . hydrochlorothiazide (HYDRODIURIL) 25 MG tablet   Oral   Take 0.5 tablets (12.5 mg total) by mouth daily.   45 tablet   3   . lisinopril (PRINIVIL,ZESTRIL) 40 MG tablet   Oral   Take 1 tablet (40 mg total) by mouth daily.   90 tablet   3   . meloxicam (MOBIC) 7.5 MG tablet   Oral   Take 7.5 mg by mouth daily.         . metoprolol succinate (TOPROL-XL) 100 MG 24 hr tablet   Oral   Take 1 tablet (100 mg total) by mouth daily. Take with or immediately following a meal.   90 tablet   3   . Multiple Vitamins-Minerals (MULTIVITAMIN PO)   Oral   Take by mouth.         . traMADol (ULTRAM) 50 MG tablet   Oral   Take 50 mg by mouth every 6 (six) hours as needed.          BP 165/84  Pulse 87  Temp(Src) 98.6 F (37 C) (Oral)  Resp 20  Ht 4\' 9"  (1.448 m)  Wt  150 lb (68.04 kg)  BMI 32.45 kg/m2  SpO2 99% Physical Exam  Nursing note and vitals reviewed. Constitutional: She is oriented to person, place, and time. She appears well-developed and well-nourished.  HENT:  Head: Normocephalic and atraumatic.  Right Ear: External ear normal.  Left Ear: External ear normal.  Nose: Nose normal.  Mouth/Throat: Oropharynx is clear and moist.  Eyes: Conjunctivae and EOM are normal. Pupils are equal, round, and reactive to light.  Neck: Normal range of motion. Neck supple.  Cardiovascular: Normal rate, regular rhythm, normal heart sounds and intact distal pulses.  Exam reveals no gallop.   No murmur heard. Pulmonary/Chest: Effort normal and breath sounds normal. No respiratory distress. She has no wheezes. She has no rales.  Abdominal: Soft. Bowel sounds are normal.  Musculoskeletal: Normal range of motion. She exhibits edema.  +2+ pitting edema bilateral lower extremities (right  Neurological: She is alert and oriented to person, place, and time. She has normal reflexes.  Skin: Skin is warm and dry. No rash  noted. No erythema. No pallor.  Psychiatric: She has a normal mood and affect. Her behavior is normal. Judgment and thought content normal.    ED Course  Procedures (including critical care time) Labs Review Labs Reviewed  COMPREHENSIVE METABOLIC PANEL - Abnormal; Notable for the following:    Glucose, Bld 127 (*)    Creatinine, Ser 1.20 (*)    Alkaline Phosphatase 119 (*)    GFR calc non Af Amer 44 (*)    GFR calc Af Amer 51 (*)    All other components within normal limits  PRO B NATRIURETIC PEPTIDE - Abnormal; Notable for the following:    Pro B Natriuretic peptide (BNP) 680.5 (*)    All other components within normal limits  D-DIMER, QUANTITATIVE - Abnormal; Notable for the following:    D-Dimer, Quant 1.22 (*)    All other components within normal limits  CBC WITH DIFFERENTIAL  TROPONIN I   Imaging Review Dg Chest 2 View  05/23/2013   CLINICAL DATA:  Chest pain.  Shortness of breath.  EXAM: CHEST  2 VIEW  COMPARISON:  07/27/2006  FINDINGS: The heart size and mediastinal contours are within normal limits. Both lungs are clear. The visualized skeletal structures are unremarkable.  IMPRESSION: No active cardiopulmonary disease.   Electronically Signed   By: Earle Gell M.D.   On: 05/23/2013 14:13   Ct Angio Chest Pe W/cm &/or Wo Cm  05/23/2013   CLINICAL DATA:  72 year old female with shortness of breath. Initial encounter. History of mastectomy.  EXAM: CT ANGIOGRAPHY CHEST WITH CONTRAST  TECHNIQUE: Multidetector CT imaging of the chest was performed using the standard protocol during bolus administration of intravenous contrast. Multiplanar CT image reconstructions and MIPs were obtained to evaluate the vascular anatomy.  CONTRAST:  158mL OMNIPAQUE IOHEXOL 350 MG/ML SOLN  COMPARISON:  Chest radiographs 1402 hr the same day.  FINDINGS: Good contrast bolus timing in the pulmonary arterial tree. Positive for right lower lobe pulmonary artery low-density filling defect (series 5, image  141). Plot mostly extends into the medial basal segment right lower lobe pulmonary arteries, but may be nonocclusive.  No central or saddle pulmonary embolus. No thrombus identified in the left lung. No thrombus identified in the right middle lobe. No thrombus identified in the right upper lobe.  Borderline right heart strain, but overall no convincing evidence of sub massive pulmonary embolus.  No pericardial or pleural effusion. Negative visible aorta except for tortuous great vessels  and mild calcified plaque. No mediastinal lymphadenopathy. Mediastinal lipomatosis. No axillary lymphadenopathy. Negative visible upper abdominal viscera.  Major airways are patent. Minor depending ground-glass opacity in both lungs most resembles atelectasis. No consolidation.  No acute osseous abnormality identified. Sequelae of left axillary node dissection. Sequelae of mastectomy.  Review of the MIP images confirms the above findings.  IMPRESSION: 1. Positive for right lower lobe pulmonary embolus. No pleural effusion or pulmonary infarct identified. 2. No other acute finding identified in the chest. No metastatic disease identified. Critical Value/emergent results were called by telephone at the time of interpretation on 05/23/2013 at 3:47 PM to Dr. Pattricia Boss , who verbally acknowledged these results.   Electronically Signed   By: Lars Pinks M.D.   On: 05/23/2013 15:47     EKG Interpretation   Date/Time:  Monday May 23 2013 13:37:04 EDT Ventricular Rate:  76 PR Interval:  212 QRS Duration: 76 QT Interval:  406 QTC Calculation: 456 R Axis:   27 Text Interpretation:  Sinus rhythm with 1st degree A-V block Otherwise  normal ECG Confirmed by Serenna Deroy MD, Andee Poles ([id number]) on 05/23/2013 1:46:49 PM      MDM   Final diagnoses:  None    Patient with elevated d-dimer, asymmetric edema, and history of breast cancer.  Plan ct angio to evaluate for pulmonary embolism. 3:31 PM  3:57 PM Dr. Nevada Crane called with positive ct  angio with rll pe. LMWH ordered.    Patient remains hemodynamically stable and hospitalist page.  Shaune Pollack, MD 05/23/13 1557  Discussed with Dr. Tamera Punt and she will discuss with consult.   Shaune Pollack, MD 05/23/13 [phone]

## 2013-05-23 NOTE — Plan of Care (Signed)
[facility]   NASHAY BRICKLEY NAT:[id number] DOB: March 14, 1940 DOA: 05/23/2013  Pt being admitted from [facility] for pulmonary embolus to telemetry. Currently stable.    Physical Exam: Filed Vitals:   05/23/13 1556  BP: 140/70  Pulse: 69  Temp:   Resp:       Labs on Admission:  Basic Metabolic Panel:  Recent Labs Lab 05/23/13 1350  NA 145  K 4.0  CL 105  CO2 28  GLUCOSE 127*  BUN 22  CREATININE 1.20*  CALCIUM 10.0   Liver Function Tests:  Recent Labs Lab 05/23/13 1350  AST 27  ALT 21  ALKPHOS 119*  BILITOT 0.4  PROT 6.6  ALBUMIN 3.8   No results found for this basename: LIPASE, AMYLASE,  in the last 168 hours No results found for this basename: AMMONIA,  in the last 168 hours CBC:  Recent Labs Lab 05/23/13 1350  WBC 9.0  NEUTROABS 6.4  HGB 14.7  HCT 44.3  MCV 97.1  PLT 202   Cardiac Enzymes:  Recent Labs Lab 05/23/13 1350  TROPONINI <0.30    BNP (last 3 results)  Recent Labs  05/23/13 1350  PROBNP 680.5*   CBG: No results found for this basename: GLUCAP,  in the last 168 hours  Radiological Exams on Admission: Dg Chest 2 View  05/23/2013   CLINICAL DATA:  Chest pain.  Shortness of breath.  EXAM: CHEST  2 VIEW  COMPARISON:  07/27/2006  FINDINGS: The heart size and mediastinal contours are within normal limits. Both lungs are clear. The visualized skeletal structures are unremarkable.  IMPRESSION: No active cardiopulmonary disease.   Electronically Signed   By: Earle Gell M.D.   On: 05/23/2013 14:13   Ct Angio Chest Pe W/cm &/or Wo Cm  05/23/2013   CLINICAL DATA:  73 year old female with shortness of breath. Initial encounter. History of mastectomy.  EXAM: CT ANGIOGRAPHY CHEST WITH CONTRAST  TECHNIQUE: Multidetector CT imaging of the chest was performed using the standard protocol during bolus administration of intravenous contrast. Multiplanar CT image reconstructions and MIPs were obtained to evaluate the vascular anatomy.  CONTRAST:   155mL OMNIPAQUE IOHEXOL 350 MG/ML SOLN  COMPARISON:  Chest radiographs 1402 hr the same day.  FINDINGS: Good contrast bolus timing in the pulmonary arterial tree. Positive for right lower lobe pulmonary artery low-density filling defect (series 5, image 141). Plot mostly extends into the medial basal segment right lower lobe pulmonary arteries, but may be nonocclusive.  No central or saddle pulmonary embolus. No thrombus identified in the left lung. No thrombus identified in the right middle lobe. No thrombus identified in the right upper lobe.  Borderline right heart strain, but overall no convincing evidence of sub massive pulmonary embolus.  No pericardial or pleural effusion. Negative visible aorta except for tortuous great vessels and mild calcified plaque. No mediastinal lymphadenopathy. Mediastinal lipomatosis. No axillary lymphadenopathy. Negative visible upper abdominal viscera.  Major airways are patent. Minor depending ground-glass opacity in both lungs most resembles atelectasis. No consolidation.  No acute osseous abnormality identified. Sequelae of left axillary node dissection. Sequelae of mastectomy.  Review of the MIP images confirms the above findings.  IMPRESSION: 1. Positive for right lower lobe pulmonary embolus. No pleural effusion or pulmonary infarct identified. 2. No other acute finding identified in the chest. No metastatic disease identified. Critical Value/emergent results were called by telephone at the time of interpretation on 05/23/2013 at 3:47 PM to Dr. Pattricia Boss , who verbally acknowledged these results.  Electronically Signed   By: Lars Pinks M.D.   On: 05/23/2013 15:47     Debbe Odea, MD [facility]  If 7PM-7AM, please contact night-coverage [email or web address] 05/23/2013, 4:49 PM

## 2013-05-23 NOTE — ED Notes (Signed)
Pt. Has heart history with no chest pain.  Pt. Reports no change in ankle size.  Upon assessment Pt. Has noted mild edema bilat.

## 2013-05-23 NOTE — Progress Notes (Signed)
ANTICOAGULATION CONSULT NOTE - Initial Consult  Pharmacy Consult for lovenox Indication: pulmonary embolus  Allergies  Allergen Reactions  . Penicillins   . Sulfa Antibiotics     INTOLERANT    Patient Measurements: Height: 4\' 9"  (144.8 cm) Weight: 156 lb 4.8 oz (70.897 kg) IBW/kg (Calculated) : 38.6 Heparin Dosing Weight:   Vital Signs: Temp: 98.5 F (36.9 C) (04/13 1957) Temp src: Oral (04/13 1957) BP: 178/90 mmHg (04/13 1957) Pulse Rate: 63 (04/13 1957)  Labs:  Recent Labs  05/23/13 1350  HGB 14.7  HCT 44.3  PLT 202  LABPROT 12.3  INR 0.93  CREATININE 1.20*  TROPONINI <0.30    Estimated Creatinine Clearance: 35 ml/min (by C-G formula based on Cr of 1.2).   Medical History: Past Medical History  Diagnosis Date  . Hypertension   . Heart murmur   . Asymmetric septal hypertrophy     WITH MILD LVOT OBSTRUCTION  . History of breast cancer   . 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 Chest pain, unspecified   . Hot flashes   . Cancer     Medications:  Scheduled:  . lisinopril  40 mg Oral Daily  . metoprolol succinate  100 mg Oral Daily  . sodium chloride  3 mL Intravenous Q12H  . sodium chloride  3 mL Intravenous Q12H   Infusions:    [name]  Patient received lovenox 70mg  x1 at ~1600 at [facility]. Baseline Hgb is 14.7, Plt 202K and SCr 1.2 (CrCl ~35)  Goal of Therapy:  Anti-Xa level 0.6-1 units/ml 4hrs after LMWH dose given Monitor platelets by anticoagulation protocol: Yes   Plan:  1) Cont lovenox 70mg  sq q12h for now. 2) CBC every 72 hours 3) F/u on transitioning to oral anticoagulant when it's appropriate.  Sarah Williamson 05/23/2013,8:48 PM

## 2013-05-23 NOTE — ED Notes (Signed)
Report given to RN accepting at [facility].  RN at [facility] to leave RN [employer] phone number incase more report needed by on coming RN

## 2013-05-23 NOTE — H&P (Signed)
[facility] History and Physical  QUINLAN MCFALL LFY:[id number] DOB: [birth year] DOA: 05/23/2013  Referring physician: ER physician in [place]. PCP: Gara Kroner, MD   Chief Complaint: Shortness of breath.  HPI: Sarah Williamson is a 73 y.o. female with history of asymmetric septal hypertrophy with mild LVOT obstruction started experiencing shortness of breath today morning. Since the symptoms persisted she had gone to the ER and CT angiogram of the chest showed pulmonary embolism and has been admitted for further management. Patient denies any chest pain fever chills productive cough. Patient has not had any recent travel or surgery and has not been on any hormone pills. Patient has not had previous episodes of PE or DVT and has not had any family with PE or DVT. Patient has been placed on Lovenox and presently is hemodynamically stable.   Review of Systems: As presented in the history of presenting illness, rest negative.  Past Medical History  Diagnosis Date  . Hypertension   . Heart murmur   . Asymmetric septal hypertrophy     WITH MILD LVOT OBSTRUCTION  . History of breast cancer   . 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 Chest pain, unspecified   . Hot flashes   . Cancer    Past Surgical History  Procedure Laterality Date  . Btl      AND 2 MISCARRIAGES; rotator cuff last june  . Mastectomy     Social History:  reports that she has never smoked. She does not have any smokeless tobacco history on file. She reports that she does not drink alcohol or use illicit drugs. Where does patient live home. Can patient participate in ADLs?  Yes.  Allergies  Allergen Reactions  . Penicillins   . Sulfa Antibiotics     INTOLERANT    Family History:  Family History  Problem Relation Age of Onset  . Hypertension Mother   . Heart failure Father   . Heart attack Father   . Diabetes Maternal Grandmother   . Diabetes Maternal Grandfather   . Heart  attack Paternal Grandmother       Prior to Admission medications   Medication Sig Start Date End Date Taking? Authorizing Provider  lisinopril (PRINIVIL,ZESTRIL) 40 MG tablet Take 1 tablet (40 mg total) by mouth daily. 11/23/12 11/23/13 Yes Thayer Headings, MD  meloxicam (MOBIC) 7.5 MG tablet Take 7.5 mg by mouth daily.   Yes Historical Provider, MD  metoprolol succinate (TOPROL-XL) 100 MG 24 hr tablet Take 1 tablet (100 mg total) by mouth daily. Take with or immediately following a meal. 11/23/12  Yes Thayer Headings, MD  Multiple Vitamins-Minerals (MULTIVITAMIN PO) Take by mouth.   Yes Historical Provider, MD  aspirin 81 MG tablet Take 81 mg by mouth daily.    Historical Provider, MD  hydrochlorothiazide (HYDRODIURIL) 25 MG tablet Take 0.5 tablets (12.5 mg total) by mouth daily. 11/23/12   Thayer Headings, MD  traMADol (ULTRAM) 50 MG tablet Take 50 mg by mouth every 6 (six) hours as needed.    Historical Provider, MD    Physical Exam: Filed Vitals:   05/23/13 1700 05/23/13 1730 05/23/13 1956 05/23/13 1957  BP: 170/63 176/68  178/90  Pulse: 64 69  63  Temp:    98.5 F (36.9 C)  TempSrc:    Oral  Resp: 13 18  18   Height:   4\' 9"  (1.448 m)   Weight:   70.897 kg (156 lb 4.8  oz)   SpO2: 100% 98%  96%     General:  Well-developed well-nourished.  Eyes: Anicteric no pallor.  ENT: No discharge from the ears eyes nose mouth.  Neck: No mass felt.  Cardiovascular: S1-S2 heard.  Respiratory: No rhonchi or crepitations.  Abdomen: Soft nontender bowel sounds present. No guarding or rigidity.  Skin: No rash.  Musculoskeletal: No edema.  Psychiatric: Appears normal.  Neurologic: Alert awake oriented to time place and person. Moves all extremities.  Labs on Admission:  Basic Metabolic Panel:  Recent Labs Lab 05/23/13 1350  NA 145  K 4.0  CL 105  CO2 28  GLUCOSE 127*  BUN 22  CREATININE 1.20*  CALCIUM 10.0   Liver Function Tests:  Recent Labs Lab 05/23/13 1350   AST 27  ALT 21  ALKPHOS 119*  BILITOT 0.4  PROT 6.6  ALBUMIN 3.8   No results found for this basename: LIPASE, AMYLASE,  in the last 168 hours No results found for this basename: AMMONIA,  in the last 168 hours CBC:  Recent Labs Lab 05/23/13 1350  WBC 9.0  NEUTROABS 6.4  HGB 14.7  HCT 44.3  MCV 97.1  PLT 202   Cardiac Enzymes:  Recent Labs Lab 05/23/13 1350  TROPONINI <0.30    BNP (last 3 results)  Recent Labs  05/23/13 1350  PROBNP 680.5*   CBG: No results found for this basename: GLUCAP,  in the last 168 hours  Radiological Exams on Admission: Dg Chest 2 View  05/23/2013   CLINICAL DATA:  Chest pain.  Shortness of breath.  EXAM: CHEST  2 VIEW  COMPARISON:  07/27/2006  FINDINGS: The heart size and mediastinal contours are within normal limits. Both lungs are clear. The visualized skeletal structures are unremarkable.  IMPRESSION: No active cardiopulmonary disease.   Electronically Signed   By: Earle Gell M.D.   On: 05/23/2013 14:13   Ct Angio Chest Pe W/cm &/or Wo Cm  05/23/2013   CLINICAL DATA:  73 year old female with shortness of breath. Initial encounter. History of mastectomy.  EXAM: CT ANGIOGRAPHY CHEST WITH CONTRAST  TECHNIQUE: Multidetector CT imaging of the chest was performed using the standard protocol during bolus administration of intravenous contrast. Multiplanar CT image reconstructions and MIPs were obtained to evaluate the vascular anatomy.  CONTRAST:  139mL OMNIPAQUE IOHEXOL 350 MG/ML SOLN  COMPARISON:  Chest radiographs 1402 hr the same day.  FINDINGS: Good contrast bolus timing in the pulmonary arterial tree. Positive for right lower lobe pulmonary artery low-density filling defect (series 5, image 141). Plot mostly extends into the medial basal segment right lower lobe pulmonary arteries, but may be nonocclusive.  No central or saddle pulmonary embolus. No thrombus identified in the left lung. No thrombus identified in the right middle lobe. No  thrombus identified in the right upper lobe.  Borderline right heart strain, but overall no convincing evidence of sub massive pulmonary embolus.  No pericardial or pleural effusion. Negative visible aorta except for tortuous great vessels and mild calcified plaque. No mediastinal lymphadenopathy. Mediastinal lipomatosis. No axillary lymphadenopathy. Negative visible upper abdominal viscera.  Major airways are patent. Minor depending ground-glass opacity in both lungs most resembles atelectasis. No consolidation.  No acute osseous abnormality identified. Sequelae of left axillary node dissection. Sequelae of mastectomy.  Review of the MIP images confirms the above findings.  IMPRESSION: 1. Positive for right lower lobe pulmonary embolus. No pleural effusion or pulmonary infarct identified. 2. No other acute finding identified in the chest. No  metastatic disease identified. Critical Value/emergent results were called by telephone at the time of interpretation on 05/23/2013 at 3:47 PM to Dr. Pattricia Boss , who verbally acknowledged these results.   Electronically Signed   By: Lars Pinks M.D.   On: 05/23/2013 15:47    EKG: Independently reviewed. Sinus rhythm with first degree AV block.  Assessment/Plan Principal Problem:   Pulmonary embolus Active Problems:   Left ventricular outflow tract obstruction   Pulmonary embolism   History of breast cancer   1. Pulmonary embolism unprovoked hemodynamically stable - presently patient is on Lovenox. If patient's insurance allows patient can be changed to newer novel anticoagulants. Check Dopplers of the lower extremity. Patient advised to follow with her oncologist for further workup on her pulmonary embolism. 2. History of left ventricular outflow tract obstruction - continue metoprolol. 3. History of breast cancer in remission - see #1. 4. Patient was recently taken off HCTZ due to possible gout.    Code Status: Full code.  Family Communication: Family at  the bedside.  Disposition Plan: Admit to inpatient.    [facility] Pager [phone].  If 7PM-7AM, please contact night-coverage [email or web address] Password [facility] 05/23/2013, 8:46 PM

## 2013-05-23 NOTE — ED Notes (Signed)
Sob. Woke with sob. Denies pain.

## 2013-05-23 NOTE — Progress Notes (Signed)
Pt admitted to unit from [facility] via [facility]. Pt A&O, VS stable w/ elevated BP, and skin intact. Will recheck BP. Admitting MD paged & currently waiting on orders. Pt oriented to unit, safety video viewed, and family is at bedside. Will continue to monitor.

## 2013-05-23 NOTE — Progress Notes (Signed)
Report received from [place], [place] for admission to [phone]

## 2013-05-23 NOTE — Progress Notes (Signed)
Pt's tele strip showing 1st degree HB. On call physician notified. No new orders written.

## 2013-05-24 DIAGNOSIS — I2699 Other pulmonary embolism without acute cor pulmonale: Secondary | ICD-10-CM

## 2013-05-24 DIAGNOSIS — I10 Essential (primary) hypertension: Secondary | ICD-10-CM

## 2013-05-24 DIAGNOSIS — I517 Cardiomegaly: Secondary | ICD-10-CM

## 2013-05-24 LAB — COMPREHENSIVE METABOLIC PANEL
ALBUMIN: 3.1 g/dL — AB (ref 3.5–5.2)
ALT: 52 U/L — AB (ref 0–35)
AST: 45 U/L — ABNORMAL HIGH (ref 0–37)
Alkaline Phosphatase: 114 U/L (ref 39–117)
BUN: 21 mg/dL (ref 6–23)
CALCIUM: 9.3 mg/dL (ref 8.4–10.5)
CO2: 23 mEq/L (ref 19–32)
Chloride: 108 mEq/L (ref 96–112)
Creatinine, Ser: 1.11 mg/dL — ABNORMAL HIGH (ref 0.50–1.10)
GFR calc Af Amer: 57 mL/min — ABNORMAL LOW (ref >90)
GFR, EST NON AFRICAN AMERICAN: 49 mL/min — AB (ref >90)
Glucose, Bld: 91 mg/dL (ref 70–99)
Potassium: 4.1 mEq/L (ref 3.7–5.3)
SODIUM: 145 meq/L (ref 137–147)
Total Bilirubin: 0.4 mg/dL (ref 0.3–1.2)
Total Protein: 5.5 g/dL — ABNORMAL LOW (ref 6.0–8.3)

## 2013-05-24 LAB — CBC
HEMATOCRIT: 39.9 % (ref 36.0–46.0)
HEMOGLOBIN: 13.3 g/dL (ref 12.0–15.0)
MCH: 31.7 pg (ref 26.0–34.0)
MCHC: 33.3 g/dL (ref 30.0–36.0)
MCV: 95 fL (ref 78.0–100.0)
Platelets: 180 10*3/uL (ref 150–400)
RBC: 4.2 MIL/uL (ref 3.87–5.11)
RDW: 13.9 % (ref 11.5–15.5)
WBC: 8 10*3/uL (ref 4.0–10.5)

## 2013-05-24 MED ORDER — RIVAROXABAN (XARELTO) EDUCATION KIT FOR DVT/PE PATIENTS
PACK | Freq: Once | Status: AC
  Administered 2013-05-24: 08:00:00
  Filled 2013-05-24: qty 1

## 2013-05-24 MED ORDER — RIVAROXABAN 15 MG PO TABS
15.0000 mg | ORAL_TABLET | Freq: Two times a day (BID) | ORAL | Status: DC
  Administered 2013-05-24: 15 mg via ORAL
  Filled 2013-05-24 (×3): qty 1

## 2013-05-24 MED ORDER — RIVAROXABAN 15 MG PO TABS: 15.0000 mg | ORAL_TABLET | Freq: Two times a day (BID) | ORAL | Status: DC

## 2013-05-24 MED ORDER — RIVAROXABAN 20 MG PO TABS: 20.0000 mg | ORAL_TABLET | Freq: Every day | ORAL | Status: DC

## 2013-05-24 NOTE — Progress Notes (Signed)
Utilization review completed.  

## 2013-05-24 NOTE — Progress Notes (Signed)
Patient given discharge teaching and paperwork regarding medications, diet, follow-up appointments and activity. Patient understanding verbalized. IV site discontinued. Skin intact as previously charted and vitals are stable. Patient being discharged to home. Husband present during discharge teaching. Toniann Ket, RN

## 2013-05-24 NOTE — Care Management Note (Signed)
    Page 1 of 1   05/24/2013     11:38:09 AM   CARE MANAGEMENT NOTE 05/24/2013  Patient:  Sarah Williamson, Sarah Williamson   Account Number:  [id number]  Date Initiated:  05/24/2013  Documentation initiated by:  Tomi Bamberger  Subjective/Objective Assessment:   dx pe  admit- lives with spouse.     Action/Plan:   gave 30 day free trial xarelto   Anticipated DC Date:  05/24/2013   Anticipated DC Plan:  [place]  CM consult      Choice offered to / List presented to:             Status of service:  Completed, signed off Medicare Important Message given?   (If response is "NO", the following Medicare IM given date fields will be blank) Date Medicare IM given:   Date Additional Medicare IM given:    Discharge Disposition:  HOME/SELF CARE  Per UR Regulation:  Reviewed for med. necessity/level of care/duration of stay  If discussed at [place] of Stay Meetings, dates discussed:    Comments:  05/24/13 [place], BSN [phone] patient lives with spouse, patient is for dc today, NCM initiated benefits check for xarelto, printed out and gave information to spouse, no prior auth needed and for 30 day supply co pay is $30.  Patient has 30 day free trial card as well.  RN will go over education about the drug with patient. NCM instructed patient's spouse to activate the card before using it.  MD will give patient a script to go with xarelto card.

## 2013-05-24 NOTE — Progress Notes (Signed)
*  PRELIMINARY RESULTS* Echocardiogram 2D Echocardiogram has been performed.  Elvia Collum 05/24/2013, 12:28 PM

## 2013-05-24 NOTE — Progress Notes (Addendum)
VASCULAR LAB PRELIMINARY  PRELIMINARY  PRELIMINARY  PRELIMINARY  Bilateral lower extremity venous duplex  completed.    Preliminary report: Negative for deep vein thrombosis bilaterally.  The right greater saphenous vein is thrombosed from its origin to proximal to mid calf.  This does not appear acute in nature.  Report called to [facility], RN @ 11:23.   Margarette Canada, RVT 05/24/2013, 11:18 AM

## 2013-05-24 NOTE — Progress Notes (Signed)
PATIENT DETAILS Name: Sarah Williamson Age: 73 y.o. Sex: female Date of Birth: 07/27/1940 Admit Date: 05/23/2013 Admitting Physician Debbe Odea, MD XBM:[id number],MWNUU W, MD  Subjective: No longer short of breath. Wants to go home.  Assessment/Plan: Principal Problem:   Pulmonary embolus - Patient was admitted with shortness of breath that started one day prior to admission. CT angiogram of the chest positive for pulmonary embolism, hence admitted for further evaluation and treatment. She does have a history of breast cancer and is status post bilateral mastectomy, and has not had any treatment for the past 5 or 6 years. From the history, this looks to be unprovoked pulmonary embolism. Patient was admitted, and started on Lovenox. Hypercoagulable panel was not sent she was already on Lovenox, suspect given the history of breast cancer, she would need oncology/hematology evaluation prior to discontinuing anticoagulation. This M.D., discussed with the patient numerous choices of long-term anticoagulation including Coumadin and newer novel anticoagulation agents. Patient and her husband expressed desire to go on the newer agents, patient has a son that is on Coumadin and is very familiar with Coumadin. Pharmacy will educate the patient regarding Xarelto (agent she chose), case management will make sure her insurance will cover Xarelto. She'll be discharged later today if she continues to do well. - Echocardiogram, and lower extremity Doppler currently pending.  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 - Continue to follow with cardiology in the outpatient setting  Hypertension - Stable, continue with lisinopril and metoprolol. Resume HCTZ on discharge    History of breast cancer - Is status post bilateral mastectomy. Patient has been advised to followup with her primary oncologist in the next 2 weeks, in light of her having unprovoked  pulmonary embolism.  Disposition: Remain inpatient  DVT Prophylaxis: Not needed as on therapeutic anticoagulation  Code Status: Full code   Family Communication Husband at bedside  Procedures:  None  CONSULTS:  None  Time spent 40 minutes-which includes 50% of the time with face-to-face with patient/ family and coordinating care related to the above assessment and plan.    MEDICATIONS: Scheduled Meds: . lisinopril  40 mg Oral Daily  . metoprolol succinate  100 mg Oral Daily  . rivaroxaban   Does not apply Once  . Rivaroxaban  15 mg Oral BID WC  . sodium chloride  3 mL Intravenous Q12H  . sodium chloride  3 mL Intravenous Q12H   Continuous Infusions:  PRN Meds:.acetaminophen, acetaminophen, hydrALAZINE, ondansetron (ZOFRAN) IV, ondansetron, traMADol  Antibiotics: Anti-infectives   None       PHYSICAL EXAM: Vital signs in last 24 hours: Filed Vitals:   05/23/13 1957 05/23/13 2344 05/24/13 0509 05/24/13 0948  BP: 178/90 142/70 151/68 165/86  Pulse: 63  72 76  Temp: 98.5 F (36.9 C)  98.1 F (36.7 C) 98.5 F (36.9 C)  TempSrc: Oral  Oral Oral  Resp: 18  18 22   Height:      Weight:      SpO2: 96%  96% 94%    Weight change:  Filed Weights   05/23/13 1321 05/23/13 1956  Weight: 68.04 kg (150 lb) 70.897 kg (156 lb 4.8 oz)   Body mass index is 33.81 kg/(m^2).   Gen Exam: Awake and alert with clear speech.   Neck: Supple, No JVD.   Chest: B/L Clear.   CVS: S1 S2 Regular, no murmurs.  Abdomen: soft, BS +, non tender, non  distended.  Extremities: no edema, lower extremities warm to touch. Neurologic: Non Focal.   Skin: No Rash.   Wounds: N/A.   Intake/Output from previous day: No intake or output data in the 24 hours ending 05/24/13 1024   LAB RESULTS: CBC  Recent Labs Lab 05/23/13 1350 05/24/13 0500  WBC 9.0 8.0  HGB 14.7 13.3  HCT 44.3 39.9  PLT 202 180  MCV 97.1 95.0  MCH 32.2 31.7  MCHC 33.2 33.3  RDW 13.8 13.9  LYMPHSABS 1.4   --   MONOABS 0.7  --   EOSABS 0.4  --   BASOSABS 0.0  --     Chemistries   Recent Labs Lab 05/23/13 1350 05/24/13 0634  NA 145 145  K 4.0 4.1  CL 105 108  CO2 28 23  GLUCOSE 127* 91  BUN 22 21  CREATININE 1.20* 1.11*  CALCIUM 10.0 9.3    CBG: No results found for this basename: GLUCAP,  in the last 168 hours  GFR Estimated Creatinine Clearance: 37.8 ml/min (by C-G formula based on Cr of 1.11).  Coagulation profile  Recent Labs Lab 05/23/13 1350  INR 0.93    Cardiac Enzymes  Recent Labs Lab 05/23/13 1350  TROPONINI <0.30    No components found with this basename: POCBNP,   Recent Labs  05/23/13 1350  DDIMER 1.22*   No results found for this basename: HGBA1C,  in the last 72 hours No results found for this basename: CHOL, HDL, LDLCALC, TRIG, CHOLHDL, LDLDIRECT,  in the last 72 hours No results found for this basename: TSH, T4TOTAL, FREET3, T3FREE, THYROIDAB,  in the last 72 hours No results found for this basename: VITAMINB12, FOLATE, FERRITIN, TIBC, IRON, RETICCTPCT,  in the last 72 hours No results found for this basename: LIPASE, AMYLASE,  in the last 72 hours  Urine Studies No results found for this basename: UACOL, UAPR, USPG, UPH, UTP, UGL, UKET, UBIL, UHGB, UNIT, UROB, ULEU, UEPI, UWBC, URBC, UBAC, CAST, CRYS, UCOM, BILUA,  in the last 72 hours  MICROBIOLOGY: No results found for this or any previous visit (from the past 240 hour(s)).  RADIOLOGY STUDIES/RESULTS: Dg Chest 2 View  05/23/2013   CLINICAL DATA:  Chest pain.  Shortness of breath.  EXAM: CHEST  2 VIEW  COMPARISON:  07/27/2006  FINDINGS: The heart size and mediastinal contours are within normal limits. Both lungs are clear. The visualized skeletal structures are unremarkable.  IMPRESSION: No active cardiopulmonary disease.   Electronically Signed   By: Earle Gell M.D.   On: 05/23/2013 14:13   Ct Angio Chest Pe W/cm &/or Wo Cm  05/23/2013   CLINICAL DATA:  73 year old female with  shortness of breath. Initial encounter. History of mastectomy.  EXAM: CT ANGIOGRAPHY CHEST WITH CONTRAST  TECHNIQUE: Multidetector CT imaging of the chest was performed using the standard protocol during bolus administration of intravenous contrast. Multiplanar CT image reconstructions and MIPs were obtained to evaluate the vascular anatomy.  CONTRAST:  149mL OMNIPAQUE IOHEXOL 350 MG/ML SOLN  COMPARISON:  Chest radiographs 1402 hr the same day.  FINDINGS: Good contrast bolus timing in the pulmonary arterial tree. Positive for right lower lobe pulmonary artery low-density filling defect (series 5, image 141). Plot mostly extends into the medial basal segment right lower lobe pulmonary arteries, but may be nonocclusive.  No central or saddle pulmonary embolus. No thrombus identified in the left lung. No thrombus identified in the right middle lobe. No thrombus identified in the right upper lobe.  Borderline right heart strain, but overall no convincing evidence of sub massive pulmonary embolus.  No pericardial or pleural effusion. Negative visible aorta except for tortuous great vessels and mild calcified plaque. No mediastinal lymphadenopathy. Mediastinal lipomatosis. No axillary lymphadenopathy. Negative visible upper abdominal viscera.  Major airways are patent. Minor depending ground-glass opacity in both lungs most resembles atelectasis. No consolidation.  No acute osseous abnormality identified. Sequelae of left axillary node dissection. Sequelae of mastectomy.  Review of the MIP images confirms the above findings.  IMPRESSION: 1. Positive for right lower lobe pulmonary embolus. No pleural effusion or pulmonary infarct identified. 2. No other acute finding identified in the chest. No metastatic disease identified. Critical Value/emergent results were called by telephone at the time of interpretation on 05/23/2013 at 3:47 PM to Dr. Pattricia Boss , who verbally acknowledged these results.   Electronically Signed    By: Lars Pinks M.D.   On: 05/23/2013 15:47    Diego Ulbricht Kristeen Mans, MD  [facility] Pager:[phone]  If 7PM-7AM, please contact night-coverage [email or web address] Password [email or web address] 05/24/2013, 10:24 AM   LOS: 1 day

## 2013-05-24 NOTE — Progress Notes (Signed)
ANTICOAGULATION CONSULT NOTE - Initial Consult  Pharmacy Consult for Xarelto Indication: pulmonary embolus  Allergies  Allergen Reactions  . Penicillins   . Sulfa Antibiotics     INTOLERANT    Patient Measurements: Height: 4\' 9"  (144.8 cm) Weight: 156 lb 4.8 oz (70.897 kg) IBW/kg (Calculated) : 38.6  Vital Signs: Temp: 98.1 F (36.7 C) (04/14 0509) Temp src: Oral (04/14 0509) BP: 151/68 mmHg (04/14 0509) Pulse Rate: 72 (04/14 0509)  Labs:  Recent Labs  05/23/13 1350 05/24/13 0500 05/24/13 0634  HGB 14.7 13.3  --   HCT 44.3 39.9  --   PLT 202 180  --   LABPROT 12.3  --   --   INR 0.93  --   --   CREATININE 1.20*  --  1.11*  TROPONINI <0.30  --   --     Estimated Creatinine Clearance: 37.8 ml/min (by C-G formula based on Cr of 1.11).   Medical History: Past Medical History  Diagnosis Date  . Hypertension   . Heart murmur   . Asymmetric septal hypertrophy     WITH MILD LVOT OBSTRUCTION  . History of breast cancer   . 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 Chest pain, unspecified   . Hot flashes   . Cancer     Assessment: 73 yo female with PE who was transferred from [facility] and continued on Lovenox.  Now to transition to Xarelto. Last dose of Lovenox this morning at 0515. Hgb is 13.3, Plt 180, no bleeding noted. SCr 1.1 with CrCl ~81mL/min.  Goal of Therapy:    Monitor platelets by anticoagulation protocol: Yes   Plan:  1. Start Xarelto 15mg  BID PO tonight at 1700 2. Xarelto BID dosing continues for 21 days- last dose will be 5/5. Then on 5/6, start Xarelto 20mg  Qsupper 3. Follow CBC and any s/s bleeding while hospitalized 4. Follow renal function 5. Will educate patient prior to discharge  Mckensey Berghuis D. Said Rueb, PharmD, BCPS Clinical Pharmacist Pager: [phone] 05/24/2013 8:31 AM

## 2013-05-24 NOTE — Discharge Instructions (Signed)
Information on my medicine - XARELTO® (rivaroxaban) ° °This medication education was reviewed with me or my healthcare representative as part of my discharge preparation.  The pharmacist that spoke with me during my hospital stay was:  Franshesca Chipman, RPH ° °WHY WAS XARELTO® PRESCRIBED FOR YOU? °Xarelto® was prescribed to treat blood clots that may have been found in the veins of your legs (deep vein thrombosis) or in your lungs (pulmonary embolism) and to reduce the risk of them occurring again. ° °What do you need to know about Xarelto®? °The starting dose is one 15 mg tablet taken TWICE daily with food for the FIRST 21 DAYS then on (enter date)  06/15/2013  the dose is changed to one 20 mg tablet taken ONCE A DAY with your evening meal. ° °DO NOT stop taking Xarelto® without talking to the health care provider who prescribed the medication.  Refill your prescription for 20 mg tablets before you run out. ° °After discharge, you should have regular check-up appointments with your healthcare provider that is prescribing your Xarelto®.  In the future your dose may need to be changed if your kidney function changes by a significant amount. ° °What do you do if you miss a dose? °If you are taking Xarelto® TWICE DAILY and you miss a dose, take it as soon as you remember. You may take two 15 mg tablets (total 30 mg) at the same time then resume your regularly scheduled 15 mg twice daily the next day. ° °If you are taking Xarelto® ONCE DAILY and you miss a dose, take it as soon as you remember on the same day then continue your regularly scheduled once daily regimen the next day. Do not take two doses of Xarelto® at the same time.  ° °Important Safety Information °Xarelto® is a blood thinner medicine that can cause bleeding. You should call your healthcare provider right away if you experience any of the following: °  Bleeding from an injury or your nose that does not stop. °  Unusual colored urine (red or dark brown) or  unusual colored stools (red or black). °  Unusual bruising for unknown reasons. °  A serious fall or if you hit your head (even if there is no bleeding). ° °Some medicines may interact with Xarelto® and might increase your risk of bleeding while on Xarelto®. To help avoid this, consult your healthcare provider or pharmacist prior to using any new prescription or non-prescription medications, including herbals, vitamins, non-steroidal anti-inflammatory drugs (NSAIDs) and supplements. ° °This website has more information on Xarelto®: www.xarelto.com. ° °

## 2013-05-24 NOTE — Discharge Summary (Signed)
PATIENT DETAILS Name: Sarah Williamson Age: 73 y.o. Sex: female Date of Birth: [birth year] MRN: [id number]. Admit Date: 05/23/2013 Admitting Physician: Debbe Odea, MD [id number] W, MD  Recommendations for Outpatient Follow-up:  1. Initiate further workup for unprovoked pulmonary embolism, hypercoagulable panel not drawn as patient already on anticoagulation. Will need referral to patient's primary oncologist, patient has a history of breast cancer.  PRIMARY DISCHARGE DIAGNOSIS:  Principal Problem:   Pulmonary embolus Active Problems:   Left ventricular outflow tract obstruction   Pulmonary embolism   History of breast cancer      PAST MEDICAL HISTORY: Past Medical History  Diagnosis Date  . Hypertension   . Heart murmur   . Asymmetric septal hypertrophy     WITH MILD LVOT OBSTRUCTION  . History of breast cancer   . 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 Chest pain, unspecified   . Hot flashes   . Cancer     DISCHARGE MEDICATIONS:   Medication List    STOP taking these medications       aspirin 81 MG tablet      TAKE these medications       hydrochlorothiazide 25 MG tablet  Commonly known as:  HYDRODIURIL  Take 0.5 tablets (12.5 mg total) by mouth daily.     lisinopril 40 MG tablet  Commonly known as:  PRINIVIL,ZESTRIL  Take 1 tablet (40 mg total) by mouth daily.     meloxicam 7.5 MG tablet  Commonly known as:  MOBIC  Take 7.5 mg by mouth daily.     metoprolol succinate 100 MG 24 hr tablet  Commonly known as:  TOPROL-XL  Take 1 tablet (100 mg total) by mouth daily. Take with or immediately following a meal.     MULTIVITAMIN PO  Take by mouth.     Rivaroxaban 15 MG Tabs tablet  Commonly known as:  XARELTO  Take 1 tablet (15 mg total) by mouth 2 (two) times daily with a meal. LAST DOSE ON 06/14/13     rivaroxaban 20 MG Tabs tablet  Commonly known as:  XARELTO  Take 1 tablet (20 mg total) by mouth daily with supper. START ONLY  06/15/13     traMADol 50 MG tablet  Commonly known as:  ULTRAM  Take 50 mg by mouth every 6 (six) hours as needed.        ALLERGIES:   Allergies  Allergen Reactions  . Penicillins   . Sulfa Antibiotics     INTOLERANT    BRIEF HPI:  See H&P, Labs, Consult and Test reports for all details in brief, patient was admitted for evaluation of shortness of breath, and found to have pulmonary embolism.  CONSULTATIONS:   None  PERTINENT RADIOLOGIC STUDIES: Dg Chest 2 View  05/23/2013   CLINICAL DATA:  Chest pain.  Shortness of breath.  EXAM: CHEST  2 VIEW  COMPARISON:  07/27/2006  FINDINGS: The heart size and mediastinal contours are within normal limits. Both lungs are clear. The visualized skeletal structures are unremarkable.  IMPRESSION: No active cardiopulmonary disease.   Electronically Signed   By: Earle Gell M.D.   On: 05/23/2013 14:13   Ct Angio Chest Pe W/cm &/or Wo Cm  05/23/2013   CLINICAL DATA:  73 year old female with shortness of breath. Initial encounter. History of mastectomy.  EXAM: CT ANGIOGRAPHY CHEST WITH CONTRAST  TECHNIQUE: Multidetector CT imaging of the chest was performed using the standard protocol during bolus administration of intravenous  contrast. Multiplanar CT image reconstructions and MIPs were obtained to evaluate the vascular anatomy.  CONTRAST:  139mL OMNIPAQUE IOHEXOL 350 MG/ML SOLN  COMPARISON:  Chest radiographs 1402 hr the same day.  FINDINGS: Good contrast bolus timing in the pulmonary arterial tree. Positive for right lower lobe pulmonary artery low-density filling defect (series 5, image 141). Plot mostly extends into the medial basal segment right lower lobe pulmonary arteries, but may be nonocclusive.  No central or saddle pulmonary embolus. No thrombus identified in the left lung. No thrombus identified in the right middle lobe. No thrombus identified in the right upper lobe.  Borderline right heart strain, but overall no convincing evidence of sub  massive pulmonary embolus.  No pericardial or pleural effusion. Negative visible aorta except for tortuous great vessels and mild calcified plaque. No mediastinal lymphadenopathy. Mediastinal lipomatosis. No axillary lymphadenopathy. Negative visible upper abdominal viscera.  Major airways are patent. Minor depending ground-glass opacity in both lungs most resembles atelectasis. No consolidation.  No acute osseous abnormality identified. Sequelae of left axillary node dissection. Sequelae of mastectomy.  Review of the MIP images confirms the above findings.  IMPRESSION: 1. Positive for right lower lobe pulmonary embolus. No pleural effusion or pulmonary infarct identified. 2. No other acute finding identified in the chest. No metastatic disease identified. Critical Value/emergent results were called by telephone at the time of interpretation on 05/23/2013 at 3:47 PM to Dr. Pattricia Boss , who verbally acknowledged these results.   Electronically Signed   By: Lars Pinks M.D.   On: 05/23/2013 15:47     PERTINENT LAB RESULTS: CBC:  Recent Labs  05/23/13 1350 05/24/13 0500  WBC 9.0 8.0  HGB 14.7 13.3  HCT 44.3 39.9  PLT 202 180   CMET CMP     Component Value Date/Time   NA 145 05/24/2013 0634   K 4.1 05/24/2013 0634   CL 108 05/24/2013 0634   CO2 23 05/24/2013 0634   GLUCOSE 91 05/24/2013 0634   BUN 21 05/24/2013 0634   CREATININE 1.11* 05/24/2013 0634   CALCIUM 9.3 05/24/2013 0634   PROT 5.5* 05/24/2013 0634   ALBUMIN 3.1* 05/24/2013 0634   AST 45* 05/24/2013 0634   ALT 52* 05/24/2013 0634   ALKPHOS 114 05/24/2013 0634   BILITOT 0.4 05/24/2013 0634   GFRNONAA 49* 05/24/2013 0634   GFRAA 57* 05/24/2013 0634    GFR Estimated Creatinine Clearance: 37.8 ml/min (by C-G formula based on Cr of 1.11). No results found for this basename: LIPASE, AMYLASE,  in the last 72 hours  Recent Labs  05/23/13 1350  TROPONINI <0.30   No components found with this basename: POCBNP,   Recent Labs   05/23/13 1350  DDIMER 1.22*   No results found for this basename: HGBA1C,  in the last 72 hours No results found for this basename: CHOL, HDL, LDLCALC, TRIG, CHOLHDL, LDLDIRECT,  in the last 72 hours No results found for this basename: TSH, T4TOTAL, FREET3, T3FREE, THYROIDAB,  in the last 72 hours No results found for this basename: VITAMINB12, FOLATE, FERRITIN, TIBC, IRON, RETICCTPCT,  in the last 72 hours Coags:  Recent Labs  05/23/13 1350  INR 0.93   Microbiology: No results found for this or any previous visit (from the past 240 hour(s)).   BRIEF HOSPITAL COURSE:  Pulmonary embolus  - Patient was admitted with shortness of breath that started one day prior to admission. CT angiogram of the chest positive for pulmonary embolism, hence admitted for further evaluation and  treatment. She does have a history of breast cancer and is status post bilateral mastectomy, and has not had any treatment for the past 5 or 6 years. From the history, this looks to be unprovoked pulmonary embolism. Patient was admitted, and started on Lovenox. Hypercoagulable panel was not sent she was already on Lovenox, suspect given the history of breast cancer, she would need oncology/hematology evaluation prior to discontinuing anticoagulation. This M.D., discussed with the patient numerous choices of long-term anticoagulation including Coumadin and newer novel anticoagulation agents. Patient and her husband expressed desire to go on the newer agents, patient has a son that is on Coumadin and is very familiar with Coumadin. Pharmacy will educate the patient regarding Xarelto (agent she chose), case management will make sure her insurance will cover Xarelto.  [facility] course was uncomplicated, she no longer has any shortness of breath, she has only ablated without any difficulty. She is anxious to be discharged. Lower extremity Doppler showed no DVT, however it did show it thrombosed right greater saphenous vein. 2-D  echocardiogram showed no right ventricular strain. - Since she is clinically improved, and  is requesting discharge, she will be discharged home on Xarelto.  Left ventricular outflow tract obstruction  - Chronic issue, stable.  - Continue to follow with cardiology in the outpatient setting   Hypertension  - Stable, continue with lisinopril and metoprolol. Resume HCTZ on discharge   History of breast cancer  - Is status post bilateral mastectomy. Patient has been advised to followup with her primary oncologist in the next 2 weeks, in light of her having unprovoked pulmonary embolism.  TODAY-DAY OF DISCHARGE:  Subjective:   Sarah Williamson today has no headache,no chest abdominal pain,no new weakness tingling or numbness, feels much better wants to go home today.   Objective:   Blood pressure 165/86, pulse 76, temperature 98.5 F (36.9 C), temperature source Oral, resp. rate 22, height 4\' 9"  (1.448 m), weight 70.897 kg (156 lb 4.8 oz), SpO2 94.00%. No intake or output data in the 24 hours ending 05/24/13 1325 Filed Weights   05/23/13 1321 05/23/13 1956  Weight: 68.04 kg (150 lb) 70.897 kg (156 lb 4.8 oz)    Exam Awake Alert, Oriented *3, No new F.N deficits, Normal affect [place].AT,PERRAL Supple Neck,No JVD, No cervical lymphadenopathy appriciated.  Symmetrical Chest wall movement, Good air movement bilaterally, CTAB RRR,No Gallops,Rubs or new Murmurs, No Parasternal Heave +ve B.Sounds, Abd Soft, Non tender, No organomegaly appriciated, No rebound -guarding or rigidity. No Cyanosis, Clubbing or edema, No new Rash or bruise  DISCHARGE CONDITION: Stable  DISPOSITION: Home  DISCHARGE INSTRUCTIONS:    Activity:  As tolerated   Diet recommendation: Heart Healthy diet  Discharge Orders   Future Orders Complete By Expires   Call MD for:  difficulty breathing, headache or visual disturbances  As directed    Call MD for:  severe uncontrolled pain  As directed    Diet - low sodium  heart healthy  As directed    Increase activity slowly  As directed       Follow-up Information   Follow up with Gara Kroner, MD. Schedule an appointment as soon as possible for a visit in 1 week.   Specialty:  Family Medicine   Contact information:   [address], [place] [address] [phone]       Follow up with [facility], MD. Schedule an appointment as soon as possible for a visit in 2 weeks.   Specialty:  Hematology and Oncology   Contact information:   [address]. [place] [address] [phone]      Total Time spent on discharge equals 45 minutes.  Signed: Henreitta Leber Ghimire 05/24/2013 1:25 PM

## 2013-06-03 ENCOUNTER — Encounter: Payer: Self-pay | Admitting: Nurse Practitioner

## 2013-06-03 ENCOUNTER — Other Ambulatory Visit: Payer: Self-pay | Admitting: *Deleted

## 2013-06-03 ENCOUNTER — Ambulatory Visit (INDEPENDENT_AMBULATORY_CARE_PROVIDER_SITE_OTHER): Payer: Medicare Other | Admitting: Nurse Practitioner

## 2013-06-03 VITALS — BP 172/83 | HR 100 | Wt 158.0 lb

## 2013-06-03 DIAGNOSIS — I10 Essential (primary) hypertension: Secondary | ICD-10-CM

## 2013-06-03 DIAGNOSIS — I2699 Other pulmonary embolism without acute cor pulmonale: Secondary | ICD-10-CM

## 2013-06-03 LAB — CBC
HCT: 44.4 % (ref 36.0–46.0)
Hemoglobin: 14.8 g/dL (ref 12.0–15.0)
MCHC: 33.4 g/dL (ref 30.0–36.0)
MCV: 95.5 fl (ref 78.0–100.0)
Platelets: 299 10*3/uL (ref 150.0–400.0)
RBC: 4.65 Mil/uL (ref 3.87–5.11)
RDW: 13.9 % (ref 11.5–14.6)
WBC: 13.5 10*3/uL — ABNORMAL HIGH (ref 4.5–10.5)

## 2013-06-03 LAB — BASIC METABOLIC PANEL
BUN: 30 mg/dL — ABNORMAL HIGH (ref 6–23)
CO2: 28 mEq/L (ref 19–32)
Calcium: 10 mg/dL (ref 8.4–10.5)
Chloride: 106 mEq/L (ref 96–112)
Creatinine, Ser: 1.3 mg/dL — ABNORMAL HIGH (ref 0.4–1.2)
GFR: 42.09 mL/min — ABNORMAL LOW (ref >60.00)
Glucose, Bld: 102 mg/dL — ABNORMAL HIGH (ref 70–99)
Potassium: 4 mEq/L (ref 3.5–5.1)
Sodium: 143 mEq/L (ref 135–145)

## 2013-06-03 MED ORDER — FUROSEMIDE 20 MG PO TABS: 20.0000 mg | ORAL_TABLET | Freq: Every day | ORAL | Status: DC

## 2013-06-03 NOTE — Patient Instructions (Addendum)
Stay on your current medicines but I am adding Lasix 20 mg each morning - to help with swelling and BP  We will check lab today  See me on a day that Dr. Acie Fredrickson is here in 3 weeks  Try to monitor some blood pressures at home  Keep restricting salt  Knee high support stockings are encouraged  Call the [place] office at [phone] if you have any questions, problems or concerns.

## 2013-06-03 NOTE — Progress Notes (Signed)
Sarah Williamson Date of Birth: 04/02/1940 Medical Record #[id number]  History of Present Illness: Sarah Williamson is seen back today for a post hospital visit. Seen for Dr. Acie Fredrickson. She has a history of breast cancer, HTN, septal hypertrophy on echo, spinal stenosis, OA and CKD.   Most recently in the hospital after presenting with shortness of breath - found to have a PE. Started on lovenox. No testing for hypercoagulable state since she had been placed on anticoagulation. Chose long term NOVAC and will need to see oncology. No DVT on venous doppler. Echo ok - no RV strain.  Comes back today. Here with her husband. Doing ok. Her breathing has improved. No chest pain. Limited by lots of back pain. She is here today because she has taken herself off of HCTZ - was taken off of this in the past due to elevated uric acid levels. Was put back on during her admission. Uric acid has increased - saw her PCP earlier this week. 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 BP high. Does not check her BP at home. Tries to limit salt. 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 She is seeing Oncology in May. No bleeding or bruising. She is on Xarelto 15 mg BID until May 5 and then to Xarelto 20 mg daily.    Current Outpatient Prescriptions  Medication Sig Dispense Refill  . lisinopril (PRINIVIL,ZESTRIL) 40 MG tablet Take 1 tablet (40 mg total) by mouth daily.  90 tablet  3  . metoprolol succinate (TOPROL-XL) 100 MG 24 hr tablet Take 1 tablet (100 mg total) by mouth daily. Take with or immediately following a meal.  90 tablet  3  . Multiple Vitamins-Minerals (MULTIVITAMIN PO) Take by mouth.      . Rivaroxaban (XARELTO) 15 MG TABS tablet Take 1 tablet (15 mg total) by mouth 2 (two) times daily with a meal. LAST DOSE ON 06/14/13  42 tablet  0  . traMADol (ULTRAM) 50 MG tablet Take 50 mg by mouth every 6 (six) hours as needed (pain).        No current  facility-administered medications for this visit.    Allergies  Allergen Reactions  . Penicillins Other (See Comments)    Occurred as a child; unknown reaction  . Sulfa Antibiotics     INTOLERANT    Past Medical History  Diagnosis Date  . Hypertension   . Heart murmur   . Asymmetric septal hypertrophy     WITH MILD LVOT OBSTRUCTION  . History of breast cancer   . 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 Chest pain, unspecified   . Hot flashes   . Cancer     Past Surgical History  Procedure Laterality Date  . Btl      AND 2 MISCARRIAGES; rotator cuff last june  . Mastectomy      History  Smoking status  . Never Smoker   Smokeless tobacco  . Not on file    History  Alcohol Use No    Family History  Problem Relation Age of Onset  . Hypertension Mother   . Heart failure Father   . Heart attack Father   . Diabetes Maternal Grandmother   . Diabetes Maternal Grandfather   . Heart attack Paternal Grandmother     Review of Systems: The review of systems is per the HPI.  All other  systems were reviewed and are negative.  Physical Exam: BP 172/83  Pulse 100  Wt 158 lb (71.668 kg) Patient is very pleasant and in no acute distress. She is short and obese. BP by me is 160/80. Skin is warm and dry. Color is normal.  HEENT is unremarkable. Normocephalic/atraumatic. PERRL. Sclera are nonicteric. Neck is supple. No masses. No JVD. Lungs are clear. Cardiac exam shows a regular rate and rhythm. Rate is 90 by me. Abdomen is soft. Extremities are with 1+ edema - left greater than right. Gait and ROM are intact. No gross neurologic deficits noted.  LABORATORY DATA: PENDING   Lab Results  Component Value Date   WBC 8.0 05/24/2013   HGB 13.3 05/24/2013   HCT 39.9 05/24/2013   PLT 180 05/24/2013   GLUCOSE 91 05/24/2013   CHOL 143 11/23/2012   TRIG 100.0 11/23/2012   HDL 56.10 11/23/2012   LDLCALC 67 11/23/2012   ALT 52* 05/24/2013   AST 45* 05/24/2013   NA  145 05/24/2013   K 4.1 05/24/2013   CL 108 05/24/2013   CREATININE 1.11* 05/24/2013   BUN 21 05/24/2013   CO2 23 05/24/2013   INR 0.93 05/23/2013   Echo Study Conclusions from April 2015  Left ventricle: The cavity size was normal. Wall thickness was increased in a pattern of mild LVH. Systolic function was vigorous. The estimated ejection fraction was in the range of 65% to 70%. Wall motion was normal; there were no regional wall motion abnormalities. There was an increased relative contribution of atrial contraction to ventricular filling.   Dg Chest 2 View  05/23/2013   IMPRESSION: No active cardiopulmonary disease.   Electronically Signed   By: Earle Gell M.D.   On: 05/23/2013 14:13   Ct Angio Chest Pe W/cm &/or Wo Cm  05/23/2013     IMPRESSION: 1. Positive for right lower lobe pulmonary embolus. No pleural effusion or pulmonary infarct identified. 2. No other acute finding identified in the chest. No metastatic disease identified. Critical Value/emergent results were called by telephone at the time of interpretation on 05/23/2013 at 3:47 PM to Dr. Pattricia Boss , who verbally acknowledged these results.   Electronically Signed   By: Lars Pinks M.D.   On: 05/23/2013 15:47    Assessment / Plan:  1. Pulmonary embolism - on anticoagulation with Xarelto - this will probably be long term.   2. Prior breast cancer - sees her oncologist in early May  3. HTN - BP not at goal - off of HCTZ - will add low dose Lasix 20 mg. Would like to give her Verapamil with her septal hypertrophy but I think this will aggravate her swelling. I have asked her to check some readings at home, use support stockings, limit salt. See back in 3 weeks with Dr. Acie Fredrickson.   Patient is agreeable to this plan and will call if any problems develop in the interim.   Burtis Junes, RN, [facility] [address] [place], [place]  [phone]

## 2013-06-09 ENCOUNTER — Other Ambulatory Visit (INDEPENDENT_AMBULATORY_CARE_PROVIDER_SITE_OTHER): Payer: Medicare Other

## 2013-06-09 DIAGNOSIS — I10 Essential (primary) hypertension: Secondary | ICD-10-CM

## 2013-06-09 LAB — BASIC METABOLIC PANEL
BUN: 56 mg/dL — ABNORMAL HIGH (ref 6–23)
CO2: 31 mEq/L (ref 19–32)
Calcium: 9.7 mg/dL (ref 8.4–10.5)
Chloride: 102 mEq/L (ref 96–112)
Creatinine, Ser: 1.8 mg/dL — ABNORMAL HIGH (ref 0.4–1.2)
GFR: 29.42 mL/min — ABNORMAL LOW (ref >60.00)
Glucose, Bld: 139 mg/dL — ABNORMAL HIGH (ref 70–99)
Potassium: 4 mEq/L (ref 3.5–5.1)
Sodium: 141 mEq/L (ref 135–145)

## 2013-06-13 ENCOUNTER — Other Ambulatory Visit: Payer: Self-pay | Admitting: *Deleted

## 2013-06-13 MED ORDER — RIVAROXABAN 15 MG PO TABS: 15.0000 mg | ORAL_TABLET | Freq: Two times a day (BID) | ORAL | Status: DC

## 2013-06-22 ENCOUNTER — Ambulatory Visit: Payer: Medicare Other | Admitting: Physician Assistant

## 2013-06-28 ENCOUNTER — Ambulatory Visit (INDEPENDENT_AMBULATORY_CARE_PROVIDER_SITE_OTHER): Payer: Medicare Other | Admitting: Nurse Practitioner

## 2013-06-28 ENCOUNTER — Encounter: Payer: Self-pay | Admitting: Nurse Practitioner

## 2013-06-28 VITALS — BP 150/84 | HR 76 | Ht <= 58 in | Wt 154.8 lb

## 2013-06-28 DIAGNOSIS — I421 Obstructive hypertrophic cardiomyopathy: Secondary | ICD-10-CM

## 2013-06-28 DIAGNOSIS — I10 Essential (primary) hypertension: Secondary | ICD-10-CM

## 2013-06-28 DIAGNOSIS — I2782 Chronic pulmonary embolism: Secondary | ICD-10-CM

## 2013-06-28 LAB — BASIC METABOLIC PANEL
BUN: 30 mg/dL — ABNORMAL HIGH (ref 6–23)
CO2: 29 mEq/L (ref 19–32)
Calcium: 10 mg/dL (ref 8.4–10.5)
Chloride: 108 mEq/L (ref 96–112)
Creatinine, Ser: 1.3 mg/dL — ABNORMAL HIGH (ref 0.4–1.2)
GFR: 43.6 mL/min — ABNORMAL LOW (ref >60.00)
Glucose, Bld: 91 mg/dL (ref 70–99)
Potassium: 5.4 mEq/L — ABNORMAL HIGH (ref 3.5–5.1)
Sodium: 143 mEq/L (ref 135–145)

## 2013-06-28 LAB — CBC
HCT: 41.4 % (ref 36.0–46.0)
Hemoglobin: 14.2 g/dL (ref 12.0–15.0)
MCHC: 34.3 g/dL (ref 30.0–36.0)
MCV: 93.8 fl (ref 78.0–100.0)
Platelets: 199 10*3/uL (ref 150.0–400.0)
RBC: 4.41 Mil/uL (ref 3.87–5.11)
RDW: 13.9 % (ref 11.5–15.5)
WBC: 8.4 10*3/uL (ref 4.0–10.5)

## 2013-06-28 NOTE — Progress Notes (Signed)
[name] Date of Birth: 10/03/1940 Medical Record #[id number]  History of Present Illness: Ms. Crandell is seen back today for a 3 week visit. Seen for Dr. Acie Fredrickson. She has a history of breast cancer, HTN, septal hypertrophy on echo, spinal stenosis, OA and CKD.   Most recently in the hospital after presenting with shortness of breath - found to have a PE. Started on lovenox. No testing for hypercoagulable state since she had been placed on anticoagulation. Chose long term NOVAC and will need to see oncology. No DVT on venous doppler. Echo ok - no RV strain.   I saw her 3 weeks ago for her post hospital visit - was doing ok.   Comes back today. Here with her husband. She says she is doing "great". Feels "100%" better. Breathing ok. No chest pain. Not dizzy or lightheaded. Has seen her oncologist in [facility] with bone scan and CT - this turned out ok. Told she will be on Xarelto for 6 months and then go to aspirin. Not clear as to whether she will have testing for hypercoagulable state once she comes off her anticoagulation. BP ok at home but her cuff may not be accurate.   Current Outpatient Prescriptions  Medication Sig Dispense Refill  . lisinopril (PRINIVIL,ZESTRIL) 40 MG tablet Take 1 tablet (40 mg total) by mouth daily.  90 tablet  3  . metoprolol succinate (TOPROL-XL) 100 MG 24 hr tablet Take 1 tablet (100 mg total) by mouth daily. Take with or immediately following a meal.  90 tablet  3  . Multiple Vitamins-Minerals (MULTIVITAMIN PO) Take by mouth.      . rivaroxaban (XARELTO) 20 MG TABS tablet Take 20 mg by mouth daily with supper.      . traMADol (ULTRAM) 50 MG tablet Take 50 mg by mouth every 6 (six) hours as needed (pain).        No current facility-administered medications for this visit.    Allergies  Allergen Reactions  . Penicillins Other (See Comments)    Occurred as a child; unknown reaction  . Sulfa Antibiotics     INTOLERANT    Past Medical History    Diagnosis Date  . Hypertension   . Heart murmur   . Asymmetric septal hypertrophy     WITH MILD LVOT OBSTRUCTION  . History of breast cancer   . 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 Chest pain, unspecified   . Hot flashes   . Cancer     Past Surgical History  Procedure Laterality Date  . Btl      AND 2 MISCARRIAGES; rotator cuff last june  . Mastectomy      History  Smoking status  . Never Smoker   Smokeless tobacco  . Not on file    History  Alcohol Use No    Family History  Problem Relation Age of Onset  . Hypertension Mother   . Heart failure Father   . Heart attack Father   . Diabetes Maternal Grandmother   . Diabetes Maternal Grandfather   . Heart attack Paternal Grandmother     Review of Systems: The review of systems is per the HPI.  All other systems were reviewed and are negative.  Physical Exam: BP 150/84  Pulse 76  Ht 4\' 8"  (1.422 m)  Wt 154 lb 12.8 oz (70.217 kg)  BMI 34.73 kg/m2 Patient is very pleasant and in no acute distress. Weight down 4 pounds. She  is short and obese. Skin is warm and dry. Color is normal.  HEENT is unremarkable. Normocephalic/atraumatic. PERRL. Sclera are nonicteric. Neck is supple. No masses. No JVD. Lungs are clear. Cardiac exam shows a regular rate and rhythm. Abdomen is soft. Extremities are without edema. Gait and ROM are intact. No gross neurologic deficits noted.  Wt Readings from Last 3 Encounters:  06/28/13 154 lb 12.8 oz (70.217 kg)  06/03/13 158 lb (71.668 kg)  05/23/13 156 lb 4.8 oz (70.897 kg)     LABORATORY DATA: PENDING Lab Results  Component Value Date   WBC 13.5* 06/03/2013   HGB 14.8 06/03/2013   HCT 44.4 06/03/2013   PLT 299.0 06/03/2013   GLUCOSE 139* 06/09/2013   CHOL 143 11/23/2012   TRIG 100.0 11/23/2012   HDL 56.10 11/23/2012   LDLCALC 67 11/23/2012   ALT 52* 05/24/2013   AST 45* 05/24/2013   NA 141 06/09/2013   K 4.0 06/09/2013   CL 102 06/09/2013   CREATININE 1.8*  06/09/2013   BUN 56* 06/09/2013   CO2 31 06/09/2013   INR 0.93 05/23/2013   Echo Study Conclusions from April 2015  Left ventricle: The cavity size was normal. Wall thickness was increased in a pattern of mild LVH. Systolic function was vigorous. The estimated ejection fraction was in the range of 65% to 70%. Wall motion was normal; there were no regional wall motion abnormalities. There was an increased relative contribution of atrial contraction to ventricular filling.  Dg Chest 2 View  05/23/2013 IMPRESSION: No active cardiopulmonary disease. Electronically Signed By: Earle Gell M.D. On: 05/23/2013 14:13  Ct Angio Chest Pe W/cm &/or Wo Cm  05/23/2013 IMPRESSION: 1. Positive for right lower lobe pulmonary embolus. No pleural effusion or pulmonary infarct identified. 2. No other acute finding identified in the chest. No metastatic disease identified. Critical Value/emergent results were called by telephone at the time of interpretation on 05/23/2013 at 3:47 PM to Dr. Pattricia Boss , who verbally acknowledged these results. Electronically Signed By: Lars Pinks M.D. On: 05/23/2013 15:47     Assessment / Plan:  1. Pulmonary embolism - on anticoagulation with Xarelto - doing well clinically. Would consider hypercoagulable work up once she is off of her Xarelto - apparently will be on this for 6 months.   2. Prior breast cancer - followed by oncology in [facility].  3. HTN - BP ok - recheck by me is 130/80. No change in her current regimen. She is wanting to resume her Lasix - maybe 3 times a week - will need to check her kidney function first. Encouraged her to continue with salt restriction and support stockings.  4. CKD - recheck labs today.   Patient is agreeable to this plan and will call if any problems develop in the interim.   Burtis Junes, RN, [place]  [address] [place]  [place], [place] [address]  [phone]

## 2013-06-28 NOTE — Patient Instructions (Addendum)
We need to check your lab today  Will try to put you back on the Lasix 3 times a week - lets see what your labs look like first  See Dr. Acie Fredrickson in September  Keep restricting your salt, wearing support hose, etc  Call the [place] office at [phone] if you have any questions, problems or concerns.

## 2013-07-29 ENCOUNTER — Other Ambulatory Visit: Payer: Medicare Other

## 2013-10-18 ENCOUNTER — Ambulatory Visit: Payer: Medicare Other | Admitting: Cardiovascular Disease

## 2013-11-10 ENCOUNTER — Encounter: Payer: Self-pay | Admitting: Cardiovascular Disease

## 2013-11-10 ENCOUNTER — Ambulatory Visit (INDEPENDENT_AMBULATORY_CARE_PROVIDER_SITE_OTHER): Payer: Medicare Other | Admitting: Cardiovascular Disease

## 2013-11-10 VITALS — BP 192/82 | HR 78 | Ht <= 58 in | Wt 154.0 lb

## 2013-11-10 DIAGNOSIS — Q248 Other specified congenital malformations of heart: Secondary | ICD-10-CM

## 2013-11-10 DIAGNOSIS — I10 Essential (primary) hypertension: Secondary | ICD-10-CM | POA: Insufficient documentation

## 2013-11-10 DIAGNOSIS — R011 Cardiac murmur, unspecified: Secondary | ICD-10-CM

## 2013-11-10 MED ORDER — METOPROLOL SUCCINATE ER 100 MG PO TB24
100.0000 mg | ORAL_TABLET | Freq: Every day | ORAL | Status: DC
Start: 2013-11-10 — End: 2013-11-10

## 2013-11-10 MED ORDER — LISINOPRIL 40 MG PO TABS: 40.0000 mg | ORAL_TABLET | Freq: Every day | ORAL | Status: DC

## 2013-11-10 MED ORDER — METOPROLOL SUCCINATE ER 100 MG PO TB24: 100.0000 mg | ORAL_TABLET | Freq: Every day | ORAL | Status: DC

## 2013-11-10 NOTE — Assessment & Plan Note (Signed)
She remains on metoprolol. She's not having any symptoms

## 2013-11-10 NOTE — Progress Notes (Signed)
Sarah Williamson Date of Birth  [birth year]       [facility] Office [address], [place], [place]  [address]   [place], [place]  [address] [phone]     [phone]   Fax  [phone]    Fax [phone]  Problem List: 1. Hypertension 2. 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 History breast cancer 4. Spinal stenosis 5. Pulmonary embolus- treated with Xarelto  History of Present Illness:  Sarah Williamson is doing well,  We  decreased her HCTZ and she  is doing well.  She still does not get much exercise.  Oct. 14, 2014:  Sarah Williamson is doing better.  She had right rotator cuff surgery and has been in rehab for most of the summer.   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    No dyspnea.    She takes her bp at home and typically get 130/ 82.    OCt. 1, 2015:  Sarah Williamson  was hospitalized in May for a pulmonary embolus. She was started on Xarelto.  She saw [name] after the hospitalization. She did not have any significant ortho problems prior to her PE.   She had been taken off her ASA ( due to gout problems)   It appears that this was an unprovoked PE.    Current Outpatient Prescriptions on File Prior to Visit  Medication Sig Dispense Refill  . lisinopril (PRINIVIL,ZESTRIL) 40 MG tablet Take 1 tablet (40 mg total) by mouth daily.  90 tablet  3  . metoprolol succinate (TOPROL-XL) 100 MG 24 hr tablet Take 1 tablet (100 mg total) by mouth daily. Take with or immediately following a meal.  90 tablet  3  . Multiple Vitamins-Minerals (MULTIVITAMIN PO) Take by mouth.      . rivaroxaban (XARELTO) 20 MG TABS tablet Take 20 mg by mouth daily with supper.      . traMADol (ULTRAM) 50 MG tablet Take 50 mg by mouth every 6 (six) hours as needed (pain).        No current facility-administered medications on file prior to visit.    Allergies  Allergen Reactions  . Penicillins Other (See Comments)     Occurred as a child; unknown reaction  . Sulfa Antibiotics     INTOLERANT    Past Medical History  Diagnosis Date  . Hypertension   . Heart murmur   . Asymmetric septal hypertrophy     WITH MILD LVOT OBSTRUCTION  . History of breast cancer   . 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 Chest pain, unspecified   . Hot flashes   . Cancer     Past Surgical History  Procedure Laterality Date  . Btl      AND 2 MISCARRIAGES; rotator cuff last june  . Mastectomy      History  Smoking status  . Never Smoker   Smokeless tobacco  . Not on file    History  Alcohol Use No    Family History  Problem Relation Age of Onset  . Hypertension Mother   . Heart failure Father   . Heart attack Father   . Diabetes Maternal Grandmother   . Diabetes Maternal Grandfather   . Heart attack Paternal Grandmother     Reviw of Systems:  Reviewed in the HPI.  All other systems are negative.  Physical Exam: Blood pressure 192/82, pulse 78, height 4\' 8"  (1.422 m), weight 154 lb (69.854 kg). General: Well developed, well nourished, in no acute distress.    Head: Normocephalic, atraumatic, sclera non-icteric, mucus membranes are moist,   Neck: Supple. Carotids are 2 + without bruits. No JVD  Lungs: Clear bilaterally to auscultation.  Heart: regular rate.  normal  S1 S2. She is a very soft systolic murmur  Abdomen: Soft, non-tender, non-distended with normal bowel sounds. No hepatomegaly. No rebound/guarding. No masses.  Msk:  Strength and tone are normal  Extremities: No clubbing or cyanosis. trace edema .   Distal pedal pulses are 2+ and equal bilaterally.  Neuro: Alert and oriented X 3. Moves all extremities spontaneously.  Psych:  Responds to questions appropriately with a normal affect.  ECG: Oct. 14 ,2014:  Sinus rhythm with 1st degree AV block, otherwise normal ECG.   Assessment / Plan:

## 2013-11-10 NOTE — Patient Instructions (Signed)
Your physician wants you to follow-up in: 1 year with Dr. Nahser. You will receive a reminder letter in the mail two months in advance. If you don't receive a letter, please call our office to schedule the follow-up appointment.   Your physician recommends that you continue on your current medications as directed. Please refer to the Current Medication list given to you today.   

## 2013-11-10 NOTE — Assessment & Plan Note (Signed)
She presented with a pulmonary embolus in the spring of this year. There's no provoking cause that we can find. She's been seen by her oncologist and was found not to have any recurrent evidence of cancer. She did not have any recent surgery.  Using recent guidelines, she probably should take anticoagulation for the rest of her life. We cannot find any provoking cause for this pulmonary embolus. She currently is tolerating the Xarelto very well.

## 2013-11-10 NOTE — Assessment & Plan Note (Addendum)
Her BP is elevated today but typically it is well controlled.   She'll keep her blood pressure log patient bring it at her next visit. I see her in one year. Continue the same medications.

## 2013-12-06 ENCOUNTER — Telehealth: Payer: Self-pay | Admitting: Cardiovascular Disease

## 2013-12-06 NOTE — Telephone Encounter (Signed)
Walk In pt Form " [facility]" Clearance" Dropped Off gave to Michelle/Nahser  10.27.15/km

## 2013-12-07 ENCOUNTER — Telehealth: Payer: Self-pay | Admitting: Cardiovascular Disease

## 2013-12-07 NOTE — Telephone Encounter (Signed)
Received request from Nurse fax box, documents faxed for surgical clearance. To: [facility]  Fax number: [phone] Attention: 10.28.15/km

## 2013-12-09 ENCOUNTER — Telehealth: Payer: Self-pay | Admitting: Cardiovascular Disease

## 2013-12-09 ENCOUNTER — Other Ambulatory Visit: Payer: Self-pay | Admitting: Orthopedic Surgery

## 2013-12-09 NOTE — Telephone Encounter (Signed)
New problem   Want to know status of cardiac clearance for her to have knee surgery. Please advise pt.

## 2013-12-09 NOTE — Telephone Encounter (Signed)
Advised pastient that the forme for cardiac clearance was faxed to [facility] on 10/28. She states they didn't receive it. Will check with Medical records.

## 2013-12-22 ENCOUNTER — Encounter (HOSPITAL_COMMUNITY): Payer: Self-pay

## 2013-12-22 ENCOUNTER — Encounter (HOSPITAL_COMMUNITY)
Admission: RE | Admit: 2013-12-22 | Discharge: 2013-12-22 | Disposition: A | Discharge status: Home / Self Care | Payer: Medicare Other | Source: Ambulatory Visit | Attending: Orthopedic Surgery | Admitting: Orthopedic Surgery

## 2013-12-22 DIAGNOSIS — Z853 Personal history of malignant neoplasm of breast: Secondary | ICD-10-CM | POA: Diagnosis not present

## 2013-12-22 DIAGNOSIS — Z01818 Encounter for other preprocedural examination: Secondary | ICD-10-CM | POA: Insufficient documentation

## 2013-12-22 DIAGNOSIS — Z7901 Long term (current) use of anticoagulants: Secondary | ICD-10-CM | POA: Insufficient documentation

## 2013-12-22 DIAGNOSIS — I10 Essential (primary) hypertension: Secondary | ICD-10-CM | POA: Diagnosis not present

## 2013-12-22 DIAGNOSIS — I517 Cardiomegaly: Secondary | ICD-10-CM | POA: Insufficient documentation

## 2013-12-22 DIAGNOSIS — Z86711 Personal history of pulmonary embolism: Secondary | ICD-10-CM | POA: Insufficient documentation

## 2013-12-22 DIAGNOSIS — Z6834 Body mass index (BMI) 34.0-34.9, adult: Secondary | ICD-10-CM | POA: Insufficient documentation

## 2013-12-22 DIAGNOSIS — I440 Atrioventricular block, first degree: Secondary | ICD-10-CM | POA: Diagnosis not present

## 2013-12-22 DIAGNOSIS — R011 Cardiac murmur, unspecified: Secondary | ICD-10-CM | POA: Insufficient documentation

## 2013-12-22 DIAGNOSIS — N289 Disorder of kidney and ureter, unspecified: Secondary | ICD-10-CM | POA: Insufficient documentation

## 2013-12-22 HISTORY — DX: Personal history of pulmonary embolism: Z86.711

## 2013-12-22 HISTORY — DX: Personal history of other medical treatment: Z92.89

## 2013-12-22 HISTORY — DX: Nausea with vomiting, unspecified: R11.2

## 2013-12-22 HISTORY — DX: Reserved for inherently not codable concepts without codable children: IMO0001

## 2013-12-22 HISTORY — DX: Unspecified osteoarthritis, unspecified site: M19.90

## 2013-12-22 HISTORY — DX: Other specified postprocedural states: Z98.890

## 2013-12-22 LAB — CBC WITH DIFFERENTIAL/PLATELET
BASOS ABS: 0 10*3/uL (ref 0.0–0.1)
BASOS PCT: 0 % (ref 0–1)
Eosinophils Absolute: 0.1 10*3/uL (ref 0.0–0.7)
Eosinophils Relative: 1 % (ref 0–5)
HEMATOCRIT: 42.7 % (ref 36.0–46.0)
Hemoglobin: 14.3 g/dL (ref 12.0–15.0)
LYMPHS PCT: 9 % — AB (ref 12–46)
Lymphs Abs: 1.1 10*3/uL (ref 0.7–4.0)
MCH: 31 pg (ref 26.0–34.0)
MCHC: 33.5 g/dL (ref 30.0–36.0)
MCV: 92.4 fL (ref 78.0–100.0)
MONO ABS: 1.4 10*3/uL — AB (ref 0.1–1.0)
Monocytes Relative: 12 % (ref 3–12)
NEUTROS ABS: 8.9 10*3/uL — AB (ref 1.7–7.7)
Neutrophils Relative %: 78 % — ABNORMAL HIGH (ref 43–77)
Platelets: 242 10*3/uL (ref 150–400)
RBC: 4.62 MIL/uL (ref 3.87–5.11)
RDW: 13.1 % (ref 11.5–15.5)
WBC: 11.5 10*3/uL — AB (ref 4.0–10.5)

## 2013-12-22 LAB — URINALYSIS, ROUTINE W REFLEX MICROSCOPIC
BILIRUBIN URINE: NEGATIVE
GLUCOSE, UA: NEGATIVE mg/dL
Ketones, ur: NEGATIVE mg/dL
Nitrite: NEGATIVE
Protein, ur: NEGATIVE mg/dL
SPECIFIC GRAVITY, URINE: 1.014 (ref 1.005–1.030)
UROBILINOGEN UA: 0.2 mg/dL (ref 0.0–1.0)
pH: 5.5 (ref 5.0–8.0)

## 2013-12-22 LAB — COMPREHENSIVE METABOLIC PANEL
ALT: 26 U/L (ref 0–35)
AST: 26 U/L (ref 0–37)
Albumin: 3.5 g/dL (ref 3.5–5.2)
Alkaline Phosphatase: 162 U/L — ABNORMAL HIGH (ref 39–117)
Anion gap: 14 (ref 5–15)
BILIRUBIN TOTAL: 0.8 mg/dL (ref 0.3–1.2)
BUN: 26 mg/dL — ABNORMAL HIGH (ref 6–23)
CO2: 27 meq/L (ref 19–32)
CREATININE: 1.1 mg/dL (ref 0.50–1.10)
Calcium: 10.1 mg/dL (ref 8.4–10.5)
Chloride: 101 mEq/L (ref 96–112)
GFR calc Af Amer: 57 mL/min — ABNORMAL LOW (ref >90)
GFR, EST NON AFRICAN AMERICAN: 49 mL/min — AB (ref >90)
Glucose, Bld: 114 mg/dL — ABNORMAL HIGH (ref 70–99)
Potassium: 4.7 mEq/L (ref 3.7–5.3)
Sodium: 142 mEq/L (ref 137–147)
Total Protein: 7 g/dL (ref 6.0–8.3)

## 2013-12-22 LAB — PROTIME-INR
INR: 1.95 — ABNORMAL HIGH (ref 0.00–1.49)
Prothrombin Time: 22.4 seconds — ABNORMAL HIGH (ref 11.6–15.2)

## 2013-12-22 LAB — URINE MICROSCOPIC-ADD ON

## 2013-12-22 LAB — APTT: APTT: 36 s (ref 24–37)

## 2013-12-22 LAB — SURGICAL PCR SCREEN
MRSA, PCR: NEGATIVE
STAPHYLOCOCCUS AUREUS: NEGATIVE

## 2013-12-22 NOTE — Pre-Procedure Instructions (Signed)
Sarah Williamson  12/22/2013   Your procedure is scheduled on:  Monday, November 23rd  Report to [facility] at [phone] AM.  Call this number if you have problems the morning of surgery: [phone]   Remember:   Do not eat food or drink liquids after midnight.   Take these medicines the morning of surgery with A SIP OF WATER: toprol, pain medication if needed   Do not wear jewelry, make-up or nail polish.  Do not wear lotions, powders, or perfume,deodorant.  Do not shave 48 hours prior to surgery. Men may shave face and neck.  Do not bring valuables to the hospital.  [facility] is not responsible for any belongings or valuables.               Contacts, dentures or bridgework may not be worn into surgery.  Leave suitcase in the car. After surgery it may be brought to your room.  For patients admitted to the hospital, discharge time is determined by your treatment team.      Please read over the following fact sheets that you were given: Pain Booklet, Coughing and Deep Breathing, MRSA Information and Surgical Site Infection Prevention  [facility] - Preparing for Surgery  Before surgery, you can play an important role.  Because skin is not sterile, your skin needs to be as free of germs as possible.  You can reduce the number of germs on you skin by washing with CHG (chlorahexidine gluconate) soap before surgery.  CHG is an antiseptic cleaner which kills germs and bonds with the skin to continue killing germs even after washing.  Please DO NOT use if you have an allergy to CHG or antibacterial soaps.  If your skin becomes reddened/irritated stop using the CHG and inform your nurse when you arrive at Short Stay.  Do not shave (including legs and underarms) for at least 48 hours prior to the first CHG shower.  You may shave your face.  Please follow these instructions carefully:   1.  Shower with CHG Soap the night before surgery and the morning of Surgery.  2.  If you  choose to wash your hair, wash your hair first as usual with your normal shampoo.  3.  After you shampoo, rinse your hair and body thoroughly to remove the shampoo.  4.  Use CHG as you would any other liquid soap.  You can apply CHG directly to the skin and wash gently with scrungie or a clean washcloth.  5.  Apply the CHG Soap to your body ONLY FROM THE NECK DOWN.  Do not use on open wounds or open sores.  Avoid contact with your eyes, ears, mouth and genitals (private parts).  Wash genitals (private parts) with your normal soap.  6.  Wash thoroughly, paying special attention to the area where your surgery will be performed.  7.  Thoroughly rinse your body with warm water from the neck down.  8.  DO NOT shower/wash with your normal soap after using and rinsing off the CHG Soap.  9.  Pat yourself dry with a clean towel.            10.  Wear clean pajamas.            11.  Place clean sheets on your bed the night of your first shower and do not sleep with pets.  Day of Surgery  Do not apply any lotions/deoderants the morning of surgery.  Please  wear clean clothes to the hospital/surgery center.

## 2013-12-22 NOTE — Progress Notes (Addendum)
Primary -dr. Marijo File cardiologist -  nasher -   Stopping xarelto 3 days prior to surgery.  Dr. Ronnie Derby office to fax over cardiac clearance

## 2013-12-23 ENCOUNTER — Other Ambulatory Visit (HOSPITAL_COMMUNITY): Payer: Medicare Other

## 2013-12-23 NOTE — Progress Notes (Signed)
Anesthesia Chart Review:  Pt is 73 year old female scheduled for R total knee arthroplasty on 01/02/2014 with Dr. Ronnie Derby.   PMH: HTN, asymmetric septal hypertrophy, PE (06/2013), heart murmur, renal insufficiency, breast cancer. BMI 34.6  Medications include: xarelto, metoprolol, lisinopril, tramadol  Preoperative labs reviewed.  BUN 26. Cr 1.1. PT 22.4, INR 1.95. PT/INR reordered for DOS.   EKG 05/24/2013: SR with 1st degree AV block  2D echo 05/24/2013:  Left ventricle: The cavity size was normal. Wall thickness was increased in a pattern of mild LVH. Systolic function was vigorous. The estimated ejection fraction was in the range of 65% to 70%. Wall motion was normal; there were no regional wall motion abnormalities. There was an increased relative contribution of atrial contraction to ventricular filling.  Pt's cardiologist is Nahser who has given clearance for surgery. Pt to stop xarelto 2 days prior to surgery.   If no changes, I anticipate pt can proceed with surgery as scheduled.   Willeen Cass, FNP-BC [facility]/Anesthesiology Phone: [phone] 12/23/2013 2:51 PM

## 2013-12-24 LAB — URINE CULTURE

## 2014-01-01 MED ORDER — SODIUM CHLORIDE 0.9 % IV SOLN: INTRAVENOUS | Status: DC

## 2014-01-01 MED ORDER — CHLORHEXIDINE GLUCONATE 4 % EX LIQD
60.0000 mL | Freq: Once | CUTANEOUS | Status: DC
  Filled 2014-01-01: qty 60

## 2014-01-01 MED ORDER — BUPIVACAINE LIPOSOME 1.3 % IJ SUSP
20.0000 mL | Freq: Once | INTRAMUSCULAR | Status: DC
  Filled 2014-01-01: qty 20

## 2014-01-01 MED ORDER — VANCOMYCIN HCL IN DEXTROSE 1-5 GM/200ML-% IV SOLN: 1000.0000 mg | INTRAVENOUS | Status: DC

## 2014-01-01 MED ORDER — TRANEXAMIC ACID 100 MG/ML IV SOLN
2000.0000 mg | INTRAVENOUS | Status: DC
  Filled 2014-01-01: qty 20

## 2014-01-02 ENCOUNTER — Encounter (HOSPITAL_COMMUNITY): Payer: Self-pay | Admitting: *Deleted

## 2014-01-02 ENCOUNTER — Encounter (HOSPITAL_COMMUNITY)
Admission: RE | Disposition: A | Discharge status: Home / Self Care | Payer: Self-pay | Source: Ambulatory Visit | Attending: Orthopedic Surgery

## 2014-01-02 ENCOUNTER — Inpatient Hospital Stay (HOSPITAL_COMMUNITY): Payer: Medicare Other | Admitting: Emergency Medicine

## 2014-01-02 ENCOUNTER — Inpatient Hospital Stay (HOSPITAL_COMMUNITY)
Admission: RE | Admit: 2014-01-02 | Discharge: 2014-01-03 | DRG: 470 | Disposition: A | Discharge status: Home / Self Care | Payer: Medicare Other | Source: Ambulatory Visit | Attending: Orthopedic Surgery | Admitting: Orthopedic Surgery

## 2014-01-02 ENCOUNTER — Inpatient Hospital Stay (HOSPITAL_COMMUNITY): Payer: Medicare Other | Admitting: Anesthesiology

## 2014-01-02 DIAGNOSIS — Z8249 Family history of ischemic heart disease and other diseases of the circulatory system: Secondary | ICD-10-CM

## 2014-01-02 DIAGNOSIS — M1990 Unspecified osteoarthritis, unspecified site: Secondary | ICD-10-CM | POA: Diagnosis not present

## 2014-01-02 DIAGNOSIS — Z9221 Personal history of antineoplastic chemotherapy: Secondary | ICD-10-CM | POA: Diagnosis not present

## 2014-01-02 DIAGNOSIS — Z9013 Acquired absence of bilateral breasts and nipples: Secondary | ICD-10-CM | POA: Diagnosis present

## 2014-01-02 DIAGNOSIS — Z880 Allergy status to penicillin: Secondary | ICD-10-CM | POA: Diagnosis not present

## 2014-01-02 DIAGNOSIS — D62 Acute posthemorrhagic anemia: Secondary | ICD-10-CM | POA: Diagnosis not present

## 2014-01-02 DIAGNOSIS — Z96659 Presence of unspecified artificial knee joint: Secondary | ICD-10-CM

## 2014-01-02 DIAGNOSIS — M1711 Unilateral primary osteoarthritis, right knee: Secondary | ICD-10-CM | POA: Diagnosis not present

## 2014-01-02 DIAGNOSIS — I10 Essential (primary) hypertension: Secondary | ICD-10-CM | POA: Diagnosis present

## 2014-01-02 HISTORY — PX: TOTAL KNEE ARTHROPLASTY: SHX125

## 2014-01-02 LAB — PROTIME-INR
INR: 1.04 (ref 0.00–1.49)
PROTHROMBIN TIME: 13.7 s (ref 11.6–15.2)

## 2014-01-02 SURGERY — ARTHROPLASTY, KNEE, TOTAL
Anesthesia: Spinal | Site: Knee | Laterality: Right

## 2014-01-02 MED ORDER — DIPHENHYDRAMINE HCL 50 MG/ML IJ SOLN
INTRAMUSCULAR | Status: DC | PRN
  Administered 2014-01-02: 25 mg via INTRAVENOUS

## 2014-01-02 MED ORDER — ZOLPIDEM TARTRATE 5 MG PO TABS: 5.0000 mg | ORAL_TABLET | Freq: Every evening | ORAL | Status: DC | PRN

## 2014-01-02 MED ORDER — ACETAMINOPHEN 650 MG RE SUPP: 650.0000 mg | Freq: Four times a day (QID) | RECTAL | Status: DC | PRN

## 2014-01-02 MED ORDER — METHOCARBAMOL 1000 MG/10ML IJ SOLN
500.0000 mg | Freq: Four times a day (QID) | INTRAVENOUS | Status: DC | PRN
  Filled 2014-01-02: qty 5

## 2014-01-02 MED ORDER — ONDANSETRON HCL 4 MG PO TABS: 4.0000 mg | ORAL_TABLET | Freq: Four times a day (QID) | ORAL | Status: DC | PRN

## 2014-01-02 MED ORDER — LISINOPRIL 40 MG PO TABS
40.0000 mg | ORAL_TABLET | Freq: Every day | ORAL | Status: DC
  Administered 2014-01-02 – 2014-01-03 (×2): 40 mg via ORAL
  Filled 2014-01-02 (×2): qty 1

## 2014-01-02 MED ORDER — DIPHENHYDRAMINE HCL 12.5 MG/5ML PO ELIX: 12.5000 mg | ORAL_SOLUTION | ORAL | Status: DC | PRN

## 2014-01-02 MED ORDER — ONDANSETRON HCL 4 MG/2ML IJ SOLN: 4.0000 mg | Freq: Four times a day (QID) | INTRAMUSCULAR | Status: DC | PRN

## 2014-01-02 MED ORDER — LIDOCAINE HCL (CARDIAC) 20 MG/ML IV SOLN
INTRAVENOUS | Status: DC | PRN
  Administered 2014-01-02: 40 mg via INTRAVENOUS

## 2014-01-02 MED ORDER — HYDROMORPHONE HCL 1 MG/ML IJ SOLN: 1.0000 mg | INTRAMUSCULAR | Status: DC | PRN

## 2014-01-02 MED ORDER — METHOCARBAMOL 500 MG PO TABS
ORAL_TABLET | ORAL | Status: AC
  Administered 2014-01-02: 500 mg via ORAL
  Filled 2014-01-02: qty 1

## 2014-01-02 MED ORDER — MEPERIDINE HCL 25 MG/ML IJ SOLN: 6.2500 mg | INTRAMUSCULAR | Status: DC | PRN

## 2014-01-02 MED ORDER — FENTANYL CITRATE 0.05 MG/ML IJ SOLN
INTRAMUSCULAR | Status: DC | PRN
  Administered 2014-01-02 (×2): 50 ug via INTRAVENOUS

## 2014-01-02 MED ORDER — BUPIVACAINE-EPINEPHRINE (PF) 0.5% -1:200000 IJ SOLN
INTRAMUSCULAR | Status: AC
  Filled 2014-01-02: qty 30

## 2014-01-02 MED ORDER — ACETAMINOPHEN 325 MG PO TABS
ORAL_TABLET | ORAL | Status: AC
  Administered 2014-01-02: 650 mg via ORAL
  Filled 2014-01-02: qty 3

## 2014-01-02 MED ORDER — BISACODYL 5 MG PO TBEC: 5.0000 mg | DELAYED_RELEASE_TABLET | Freq: Every day | ORAL | Status: DC | PRN

## 2014-01-02 MED ORDER — MIDAZOLAM HCL 2 MG/2ML IJ SOLN
INTRAMUSCULAR | Status: AC
  Filled 2014-01-02: qty 2

## 2014-01-02 MED ORDER — DOCUSATE SODIUM 100 MG PO CAPS
100.0000 mg | ORAL_CAPSULE | Freq: Two times a day (BID) | ORAL | Status: DC
  Administered 2014-01-02 – 2014-01-03 (×2): 100 mg via ORAL
  Filled 2014-01-02 (×3): qty 1

## 2014-01-02 MED ORDER — OXYCODONE HCL ER 10 MG PO T12A
10.0000 mg | EXTENDED_RELEASE_TABLET | Freq: Two times a day (BID) | ORAL | Status: DC
  Administered 2014-01-02 – 2014-01-03 (×2): 10 mg via ORAL
  Filled 2014-01-02 (×2): qty 1

## 2014-01-02 MED ORDER — ONDANSETRON HCL 4 MG/2ML IJ SOLN: 4.0000 mg | Freq: Once | INTRAMUSCULAR | Status: DC | PRN

## 2014-01-02 MED ORDER — OXYCODONE HCL 5 MG PO TABS: 5.0000 mg | ORAL_TABLET | Freq: Once | ORAL | Status: DC | PRN

## 2014-01-02 MED ORDER — HYDROMORPHONE HCL 1 MG/ML IJ SOLN
0.2500 mg | INTRAMUSCULAR | Status: DC | PRN
  Administered 2014-01-02 (×4): 0.5 mg via INTRAVENOUS

## 2014-01-02 MED ORDER — SODIUM CHLORIDE 0.9 % IV SOLN
INTRAVENOUS | Status: DC
  Administered 2014-01-03: 04:00:00 via INTRAVENOUS

## 2014-01-02 MED ORDER — VANCOMYCIN HCL IN DEXTROSE 1-5 GM/200ML-% IV SOLN
INTRAVENOUS | Status: AC
  Administered 2014-01-02: 1000 mg via INTRAVENOUS
  Filled 2014-01-02: qty 200

## 2014-01-02 MED ORDER — ACETAMINOPHEN 500 MG PO TABS
1000.0000 mg | ORAL_TABLET | Freq: Four times a day (QID) | ORAL | Status: AC
  Administered 2014-01-03 (×3): 1000 mg via ORAL
  Filled 2014-01-02 (×3): qty 2

## 2014-01-02 MED ORDER — FENTANYL CITRATE 0.05 MG/ML IJ SOLN
INTRAMUSCULAR | Status: AC
  Administered 2014-01-02: 100 ug
  Filled 2014-01-02: qty 2

## 2014-01-02 MED ORDER — VANCOMYCIN HCL IN DEXTROSE 1-5 GM/200ML-% IV SOLN
1000.0000 mg | Freq: Two times a day (BID) | INTRAVENOUS | Status: AC
  Administered 2014-01-03: 1000 mg via INTRAVENOUS
  Filled 2014-01-02: qty 200

## 2014-01-02 MED ORDER — PROPOFOL 10 MG/ML IV BOLUS
INTRAVENOUS | Status: AC
  Filled 2014-01-02: qty 20

## 2014-01-02 MED ORDER — SODIUM CHLORIDE 0.9 % IR SOLN
Status: DC | PRN
  Administered 2014-01-02: 3000 mL

## 2014-01-02 MED ORDER — ALUM & MAG HYDROXIDE-SIMETH 200-200-20 MG/5ML PO SUSP: 30.0000 mL | ORAL | Status: DC | PRN

## 2014-01-02 MED ORDER — MIDAZOLAM HCL 2 MG/2ML IJ SOLN
INTRAMUSCULAR | Status: AC
  Administered 2014-01-02: 2 mg
  Filled 2014-01-02: qty 2

## 2014-01-02 MED ORDER — FLEET ENEMA 7-19 GM/118ML RE ENEM: 1.0000 | ENEMA | Freq: Once | RECTAL | Status: AC | PRN

## 2014-01-02 MED ORDER — PROPOFOL INFUSION 10 MG/ML OPTIME
INTRAVENOUS | Status: DC | PRN
  Administered 2014-01-02: 25 ug/kg/min via INTRAVENOUS

## 2014-01-02 MED ORDER — METOCLOPRAMIDE HCL 5 MG/ML IJ SOLN: 5.0000 mg | Freq: Three times a day (TID) | INTRAMUSCULAR | Status: DC | PRN

## 2014-01-02 MED ORDER — FENTANYL CITRATE 0.05 MG/ML IJ SOLN
INTRAMUSCULAR | Status: AC
  Filled 2014-01-02: qty 5

## 2014-01-02 MED ORDER — ACETAMINOPHEN 325 MG PO TABS
ORAL_TABLET | ORAL | Status: AC
  Filled 2014-01-02: qty 1

## 2014-01-02 MED ORDER — BUPIVACAINE LIPOSOME 1.3 % IJ SUSP
INTRAMUSCULAR | Status: DC | PRN
  Administered 2014-01-02: 20 mL

## 2014-01-02 MED ORDER — OXYCODONE HCL 5 MG PO TABS
5.0000 mg | ORAL_TABLET | ORAL | Status: DC | PRN
  Administered 2014-01-02 (×3): 10 mg via ORAL
  Administered 2014-01-03 (×2): 5 mg via ORAL
  Administered 2014-01-03: 10 mg via ORAL
  Filled 2014-01-02 (×2): qty 2
  Filled 2014-01-02 (×2): qty 1

## 2014-01-02 MED ORDER — HYDROMORPHONE HCL 1 MG/ML IJ SOLN
INTRAMUSCULAR | Status: AC
  Administered 2014-01-02: 0.5 mg via INTRAVENOUS
  Filled 2014-01-02: qty 1

## 2014-01-02 MED ORDER — LACTATED RINGERS IV SOLN
INTRAVENOUS | Status: DC
  Administered 2014-01-02 (×2): via INTRAVENOUS

## 2014-01-02 MED ORDER — PHENOL 1.4 % MT LIQD: 1.0000 | OROMUCOSAL | Status: DC | PRN

## 2014-01-02 MED ORDER — DIPHENHYDRAMINE HCL 50 MG/ML IJ SOLN
INTRAMUSCULAR | Status: AC
  Filled 2014-01-02: qty 1

## 2014-01-02 MED ORDER — OXYCODONE HCL 5 MG PO TABS
ORAL_TABLET | ORAL | Status: AC
  Administered 2014-01-02: 10 mg via ORAL
  Filled 2014-01-02: qty 2

## 2014-01-02 MED ORDER — ACETAMINOPHEN 325 MG PO TABS
650.0000 mg | ORAL_TABLET | Freq: Four times a day (QID) | ORAL | Status: DC | PRN
  Administered 2014-01-02: 650 mg via ORAL

## 2014-01-02 MED ORDER — RIVAROXABAN 20 MG PO TABS
20.0000 mg | ORAL_TABLET | Freq: Every day | ORAL | Status: DC
  Filled 2014-01-02: qty 1

## 2014-01-02 MED ORDER — MENTHOL 3 MG MT LOZG: 1.0000 | LOZENGE | OROMUCOSAL | Status: DC | PRN

## 2014-01-02 MED ORDER — SENNOSIDES-DOCUSATE SODIUM 8.6-50 MG PO TABS: 1.0000 | ORAL_TABLET | Freq: Every evening | ORAL | Status: DC | PRN

## 2014-01-02 MED ORDER — METHOCARBAMOL 500 MG PO TABS
500.0000 mg | ORAL_TABLET | Freq: Four times a day (QID) | ORAL | Status: DC | PRN
  Administered 2014-01-02 – 2014-01-03 (×4): 500 mg via ORAL
  Filled 2014-01-02 (×3): qty 1

## 2014-01-02 MED ORDER — METOPROLOL SUCCINATE ER 100 MG PO TB24
100.0000 mg | ORAL_TABLET | Freq: Every day | ORAL | Status: DC
  Administered 2014-01-03: 100 mg via ORAL
  Filled 2014-01-02: qty 1

## 2014-01-02 MED ORDER — SODIUM CHLORIDE 0.9 % IR SOLN
Status: DC | PRN
  Administered 2014-01-02: 1000 mL

## 2014-01-02 MED ORDER — LIDOCAINE HCL (CARDIAC) 20 MG/ML IV SOLN
INTRAVENOUS | Status: AC
  Filled 2014-01-02: qty 5

## 2014-01-02 MED ORDER — TRANEXAMIC ACID 100 MG/ML IV SOLN
2000.0000 mg | INTRAVENOUS | Status: DC | PRN
  Administered 2014-01-02: 2000 mg via INTRAVENOUS

## 2014-01-02 MED ORDER — MIDAZOLAM HCL 5 MG/5ML IJ SOLN
INTRAMUSCULAR | Status: DC | PRN
  Administered 2014-01-02: 2 mg via INTRAVENOUS

## 2014-01-02 MED ORDER — OXYCODONE HCL 5 MG/5ML PO SOLN: 5.0000 mg | Freq: Once | ORAL | Status: DC | PRN

## 2014-01-02 MED ORDER — METOCLOPRAMIDE HCL 5 MG PO TABS
5.0000 mg | ORAL_TABLET | Freq: Three times a day (TID) | ORAL | Status: DC | PRN
  Filled 2014-01-02: qty 2

## 2014-01-02 MED ORDER — BUPIVACAINE-EPINEPHRINE 0.5% -1:200000 IJ SOLN
INTRAMUSCULAR | Status: DC | PRN
  Administered 2014-01-02: 30 mL

## 2014-01-02 SURGICAL SUPPLY — 61 items
BANDAGE ESMARK 6X9 LF (GAUZE/BANDAGES/DRESSINGS) ×1 IMPLANT
BLADE SAGITTAL 13X1.27X60 (BLADE) ×2 IMPLANT
BLADE SAW SGTL 83.5X18.5 (BLADE) ×2 IMPLANT
BLADE SURG 10 STRL SS (BLADE) ×2 IMPLANT
BNDG CMPR 9X6 STRL LF SNTH (GAUZE/BANDAGES/DRESSINGS) ×1
BNDG ESMARK 6X9 LF (GAUZE/BANDAGES/DRESSINGS) ×2
BOWL SMART MIX CTS (DISPOSABLE) ×2 IMPLANT
CAP KNEE CMT PERSONA ×2 IMPLANT
CEMENT BONE SIMPLEX SPEEDSET (Cement) ×4 IMPLANT
COVER SURGICAL LIGHT HANDLE (MISCELLANEOUS) ×2 IMPLANT
CUFF TOURNIQUET SINGLE 34IN LL (TOURNIQUET CUFF) ×2 IMPLANT
DRAPE EXTREMITY T 121X128X90 (DRAPE) ×2 IMPLANT
DRAPE IMP U-DRAPE 54X76 (DRAPES) ×2 IMPLANT
DRAPE INCISE IOBAN 66X45 STRL (DRAPES) ×4 IMPLANT
DRAPE PROXIMA HALF (DRAPES) IMPLANT
DRAPE U-SHAPE 47X51 STRL (DRAPES) ×2 IMPLANT
DRSG ADAPTIC 3X8 NADH LF (GAUZE/BANDAGES/DRESSINGS) ×2 IMPLANT
DRSG PAD ABDOMINAL 8X10 ST (GAUZE/BANDAGES/DRESSINGS) ×2 IMPLANT
DURAPREP 26ML APPLICATOR (WOUND CARE) ×4 IMPLANT
ELECT REM PT RETURN 9FT ADLT (ELECTROSURGICAL) ×2
ELECTRODE REM PT RTRN 9FT ADLT (ELECTROSURGICAL) ×1 IMPLANT
EVACUATOR 1/8 PVC DRAIN (DRAIN) ×2 IMPLANT
GAUZE SPONGE 4X4 12PLY STRL (GAUZE/BANDAGES/DRESSINGS) ×2 IMPLANT
GLOVE BIOGEL M 7.0 STRL (GLOVE) ×1 IMPLANT
GLOVE BIOGEL PI IND STRL 6.5 (GLOVE) ×1 IMPLANT
GLOVE BIOGEL PI IND STRL 7.5 (GLOVE) ×1 IMPLANT
GLOVE BIOGEL PI IND STRL 8.5 (GLOVE) ×2 IMPLANT
GLOVE BIOGEL PI INDICATOR 6.5 (GLOVE) ×1
GLOVE BIOGEL PI INDICATOR 7.5 (GLOVE) ×1
GLOVE BIOGEL PI INDICATOR 8.5 (GLOVE) ×2
GLOVE ECLIPSE 6.5 STRL STRAW (GLOVE) ×2 IMPLANT
GLOVE SURG ORTHO 8.0 STRL STRW (GLOVE) ×4 IMPLANT
GOWN STRL REUS W/ TWL LRG LVL3 (GOWN DISPOSABLE) ×1 IMPLANT
GOWN STRL REUS W/ TWL XL LVL3 (GOWN DISPOSABLE) ×2 IMPLANT
GOWN STRL REUS W/TWL LRG LVL3 (GOWN DISPOSABLE) ×2
GOWN STRL REUS W/TWL XL LVL3 (GOWN DISPOSABLE) ×4
HANDPIECE INTERPULSE COAX TIP (DISPOSABLE) ×2
HOOD PEEL AWAY FACE SHEILD DIS (HOOD) ×6 IMPLANT
KIT BASIN OR (CUSTOM PROCEDURE TRAY) ×2 IMPLANT
KIT ROOM TURNOVER OR (KITS) ×2 IMPLANT
MANIFOLD NEPTUNE II (INSTRUMENTS) ×2 IMPLANT
NEEDLE 22X1 1/2 (OR ONLY) (NEEDLE) ×4 IMPLANT
NS IRRIG 1000ML POUR BTL (IV SOLUTION) ×2 IMPLANT
PACK TOTAL JOINT (CUSTOM PROCEDURE TRAY) ×2 IMPLANT
PACK UNIVERSAL I (CUSTOM PROCEDURE TRAY) ×2 IMPLANT
PAD ARMBOARD 7.5X6 YLW CONV (MISCELLANEOUS) ×4 IMPLANT
PADDING CAST COTTON 6X4 STRL (CAST SUPPLIES) ×2 IMPLANT
SET HNDPC FAN SPRY TIP SCT (DISPOSABLE) ×1 IMPLANT
SPONGE GAUZE 4X4 12PLY STER LF (GAUZE/BANDAGES/DRESSINGS) ×1 IMPLANT
STAPLER VISISTAT 35W (STAPLE) ×2 IMPLANT
SUCTION FRAZIER TIP 10 FR DISP (SUCTIONS) ×2 IMPLANT
SUT BONE WAX W31G (SUTURE) ×2 IMPLANT
SUT VIC AB 0 CTB1 27 (SUTURE) ×4 IMPLANT
SUT VIC AB 1 CT1 27 (SUTURE) ×4
SUT VIC AB 1 CT1 27XBRD ANBCTR (SUTURE) ×2 IMPLANT
SUT VIC AB 2-0 CT1 27 (SUTURE) ×4
SUT VIC AB 2-0 CT1 TAPERPNT 27 (SUTURE) ×2 IMPLANT
SYR 20CC LL (SYRINGE) ×4 IMPLANT
TOWEL OR 17X24 6PK STRL BLUE (TOWEL DISPOSABLE) ×2 IMPLANT
TOWEL OR 17X26 10 PK STRL BLUE (TOWEL DISPOSABLE) ×2 IMPLANT
WATER STERILE IRR 1000ML POUR (IV SOLUTION) ×4 IMPLANT

## 2014-01-02 NOTE — Progress Notes (Signed)
Orthopedic Tech Progress Note Patient Details:  Sarah Williamson 09-05-1940 [id number]  CPM Right Knee CPM Right Knee: On Right Knee Flexion (Degrees): 60 Right Knee Extension (Degrees): 0 Additional Comments: trapeze bar patient helper Viewed order from doctor's order list  Hildred Priest 01/02/2014, 3:56 PM

## 2014-01-02 NOTE — H&P (Signed)
Sarah Williamson MRN:  [id number] DOB/SEX:  1940/02/12/female  CHIEF COMPLAINT:  Painful right Knee  HISTORY: Patient is a 73 y.o. female presented with a history of pain in the right knee. Onset of symptoms was gradual starting several years ago with gradually worsening course since that time. Prior procedures on the knee include arthroscopy. Patient has been treated conservatively with over-the-counter NSAIDs and activity modification. Patient currently rates pain in the knee at 10 out of 10 with activity. There is pain at night.  PAST MEDICAL HISTORY: Patient Active Problem List   Diagnosis Date Noted  . HTN (hypertension) 11/10/2013  . Pulmonary embolus 05/23/2013  . Pulmonary embolism 05/23/2013  . History of breast cancer 05/23/2013  . 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 Heart murmur 07/14/2011   Past Medical History  Diagnosis Date  . Hypertension   . Asymmetric septal hypertrophy     WITH MILD LVOT OBSTRUCTION  . History of breast cancer   . 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 Chest pain, unspecified   . Hot flashes   . Cancer   . PONV (postoperative nausea and vomiting)   . Shortness of breath dyspnea     exertion  . Heart murmur     sees nasher  . Personal history of PE (pulmonary embolism) 2015  . Arthritis   . History of blood transfusion     due to chemo   Past Surgical History  Procedure Laterality Date  . Btl      AND 2 MISCARRIAGES; rotator cuff last june  . Rotator cuff repair Right   . Mastectomy Bilateral     7 year ago; lymph nodes removed right side  . Tubal ligation    . Eye surgery Bilateral     cataracts     MEDICATIONS:   No prescriptions prior to admission    ALLERGIES:   Allergies  Allergen Reactions  . Penicillins Other (See Comments)    Occurred as a child; unknown reaction    REVIEW OF SYSTEMS:  A comprehensive review of systems was negative.   FAMILY HISTORY:   Family History   Problem Relation Age of Onset  . Hypertension Mother   . Heart failure Father   . Heart attack Father   . Diabetes Maternal Grandmother   . Diabetes Maternal Grandfather   . Heart attack Paternal Grandmother     SOCIAL HISTORY:   History  Substance Use Topics  . Smoking status: Never Smoker   . Smokeless tobacco: Not on file  . Alcohol Use: No     EXAMINATION:  Vital signs in last 24 hours:    General appearance: alert, cooperative and no distress Lungs: clear to auscultation bilaterally Heart: regular rate and rhythm, S1, S2 normal, no murmur, click, rub or gallop Abdomen: soft, non-tender; bowel sounds normal; no masses,  no organomegaly Extremities: extremities normal, atraumatic, no cyanosis or edema and Homans sign is negative, no sign of DVT Pulses: 2+ and symmetric Skin: Skin color, texture, turgor normal. No rashes or lesions Neurologic: Alert and oriented X 3, normal strength and tone. Normal symmetric reflexes. Normal coordination and gait  Musculoskeletal:  ROM 0-110, Ligaments intact,  Imaging Review Plain radiographs demonstrate severe degenerative joint disease of the right knee. The overall alignment is significant varus. The bone quality appears to be good for age and reported activity level.  Assessment/Plan: End stage arthritis, right knee   The patient history, physical examination and imaging  studies are consistent with advanced degenerative joint disease of the right knee. The patient has failed conservative treatment.  The clearance notes were reviewed.  After discussion with the patient it was felt that Total Knee Replacement was indicated. The procedure,  risks, and benefits of total knee arthroplasty were presented and reviewed. The risks including but not limited to aseptic loosening, infection, blood clots, vascular injury, stiffness, patella tracking problems complications among others were discussed. The patient acknowledged the explanation, agreed  to proceed with the plan.  Leighla Chestnutt 01/02/2014, 6:49 AM

## 2014-01-02 NOTE — Anesthesia Preprocedure Evaluation (Addendum)
Anesthesia Evaluation  Patient identified by MRN, date of birth, ID band Patient awake    Reviewed: Allergy & Precautions, H&P , NPO status , Patient's Chart, lab work & pertinent test results  Airway Mallampati: I  TM Distance: >3 FB Neck ROM: Full    Dental   Pulmonary          Cardiovascular hypertension, Pt. on medications     Neuro/Psych    GI/Hepatic   Endo/Other    Renal/GU      Musculoskeletal   Abdominal   Peds  Hematology   Anesthesia Other Findings   Reproductive/Obstetrics                           Anesthesia Physical Anesthesia Plan  ASA: II  Anesthesia Plan: Spinal   Post-op Pain Management: MAC Combined w/ Regional for Post-op pain   Induction: Intravenous  Airway Management Planned: Natural Airway  Additional Equipment:   Intra-op Plan:   Post-operative Plan:   Informed Consent: I have reviewed the patients History and Physical, chart, labs and discussed the procedure including the risks, benefits and alternatives for the proposed anesthesia with the patient or authorized representative who has indicated his/her understanding and acceptance.     Plan Discussed with: CRNA and Surgeon  Anesthesia Plan Comments: (Pt off Xarelto for greater than 48hrs, Ok for Spinal.)      Anesthesia Quick Evaluation

## 2014-01-02 NOTE — Anesthesia Procedure Notes (Addendum)
Anesthesia Regional Block:  Adductor canal block  Pre-Anesthetic Checklist: ,, timeout performed, Correct Patient, Correct Site, Correct Laterality, Correct Procedure, Correct Position, site marked, Risks and benefits discussed,  Surgical consent,  Pre-op evaluation,  At surgeon's request and post-op pain management  Laterality: Right  Prep: chloraprep       Needles:  Injection technique: Single-shot  Needle Type: Echogenic Stimulator Needle     Needle Length: 9cm 9 cm Needle Gauge: 21 and 21 G    Additional Needles:  Procedures: ultrasound guided (picture in chart) Adductor canal block Narrative:  Start time: 01/02/2014 11:10 AM End time: 01/02/2014 11:20 AM Injection made incrementally with aspirations every 5 mL.  Performed by: Personally   Additional Notes: Monitors applied. Patient sedated. Sterile prep and drape,hand hygiene and sterile gloves were used. Relevant anatomy identified.Needle position confirmed.Local anesthetic injected incrementally after negative aspiration. Local anesthetic spread visualized around nerve(s). Vascular puncture avoided. No complications. Image printed for medical record.The patient tolerated the procedure well.    Lillia Abed MD   Spinal Patient location during procedure: OR Start time: 01/02/2014 12:05 PM End time: 01/02/2014 12:15 PM Staffing Anesthesiologist: Lillia Abed Performed by: anesthesiologist  Preanesthetic Checklist Completed: patient identified, site marked, surgical consent, pre-op evaluation, timeout performed, IV checked, risks and benefits discussed and monitors and equipment checked Spinal Block Patient position: sitting Prep: Betadine Patient monitoring: heart rate, cardiac monitor, continuous pulse ox and blood pressure Approach: midline Location: L4-5 Injection technique: single-shot Needle Needle type: Pencan  Needle gauge: 24 G Needle length: 9 cm Needle insertion depth: 4 cm Assessment Sensory  level: T8 Events: paresthesia

## 2014-01-02 NOTE — Transfer of Care (Signed)
Immediate Anesthesia Transfer of Care Note  Patient: Sarah Williamson  Procedure(s) Performed: Procedure(s): RIGHT TOTAL KNEE ARTHROPLASTY (Right)  Patient Location: PACU  Anesthesia Type:Spinal  Level of Consciousness: awake, oriented, patient cooperative and responds to stimulation  Airway & Oxygen Therapy: Patient Spontanous Breathing and Patient connected to nasal cannula oxygen  Post-op Assessment: Report given to PACU RN and Post -op Vital signs reviewed and stable  Post vital signs: Reviewed and stable  Complications: No apparent anesthesia complications

## 2014-01-03 ENCOUNTER — Encounter (HOSPITAL_COMMUNITY): Payer: Self-pay | Admitting: Orthopedic Surgery

## 2014-01-03 LAB — CBC
HEMATOCRIT: 35.4 % — AB (ref 36.0–46.0)
HEMOGLOBIN: 11.7 g/dL — AB (ref 12.0–15.0)
MCH: 31.8 pg (ref 26.0–34.0)
MCHC: 33.1 g/dL (ref 30.0–36.0)
MCV: 96.2 fL (ref 78.0–100.0)
Platelets: 220 10*3/uL (ref 150–400)
RBC: 3.68 MIL/uL — ABNORMAL LOW (ref 3.87–5.11)
RDW: 13.7 % (ref 11.5–15.5)
WBC: 10.3 10*3/uL (ref 4.0–10.5)

## 2014-01-03 LAB — BASIC METABOLIC PANEL
Anion gap: 11 (ref 5–15)
BUN: 28 mg/dL — AB (ref 6–23)
CHLORIDE: 110 meq/L (ref 96–112)
CO2: 21 meq/L (ref 19–32)
Calcium: 8.5 mg/dL (ref 8.4–10.5)
Creatinine, Ser: 1.26 mg/dL — ABNORMAL HIGH (ref 0.50–1.10)
GFR calc Af Amer: 48 mL/min — ABNORMAL LOW (ref >90)
GFR calc non Af Amer: 42 mL/min — ABNORMAL LOW (ref >90)
Glucose, Bld: 113 mg/dL — ABNORMAL HIGH (ref 70–99)
Potassium: 4.8 mEq/L (ref 3.7–5.3)
Sodium: 142 mEq/L (ref 137–147)

## 2014-01-03 MED ORDER — RIVAROXABAN 20 MG PO TABS: 20.0000 mg | ORAL_TABLET | Freq: Every day | ORAL | Status: DC

## 2014-01-03 MED ORDER — OXYCODONE HCL ER 10 MG PO T12A: 10.0000 mg | EXTENDED_RELEASE_TABLET | Freq: Two times a day (BID) | ORAL | Status: DC

## 2014-01-03 MED ORDER — METHOCARBAMOL 500 MG PO TABS: 500.0000 mg | ORAL_TABLET | Freq: Four times a day (QID) | ORAL | Status: DC | PRN

## 2014-01-03 MED ORDER — OXYCODONE HCL 5 MG PO TABS: 5.0000 mg | ORAL_TABLET | ORAL | Status: DC | PRN

## 2014-01-03 NOTE — Progress Notes (Addendum)
[facility]  Lara Mulch, MD   Carlynn Spry, PA-C [place], [place], [place]  [address]                             [phone]   PROGRESS NOTE  Subjective:  negative for Chest Pain  negative for Shortness of Breath  negative for Nausea/Vomiting   negative for Calf Pain  negative for Bowel Movement   Tolerating Diet: yes         Patient reports pain as 6 on 0-10 scale.    Objective: Vital signs in last 24 hours:   Patient Vitals for the past 24 hrs:  BP Temp Pulse Resp SpO2  01/03/14 1252 (!) 153/66 mmHg 98.9 F (37.2 C) 73 16 94 %  01/03/14 0600 (!) 135/50 mmHg 99.3 F (37.4 C) 68 18 100 %  01/03/14 0159 (!) 134/46 mmHg 98.7 F (37.1 C) 69 16 99 %  01/03/14 0000 - - - 18 99 %  01/02/14 2015 (!) 154/65 mmHg 97.9 F (36.6 C) 66 16 100 %  01/02/14 2000 - - - 18 100 %  01/02/14 1920 (!) 153/66 mmHg 98.1 F (36.7 C) 61 (!) 21 100 %  01/02/14 1815 135/79 mmHg - (!) 55 13 100 %  01/02/14 1715 (!) 149/73 mmHg - (!) 58 17 99 %  01/02/14 1615 131/63 mmHg 98 F (36.7 C) (!) 54 12 98 %  01/02/14 1600 (!) 147/67 mmHg - (!) 55 17 98 %  01/02/14 1545 (!) 149/61 mmHg - (!) 57 18 98 %  01/02/14 1530 (!) 132/92 mmHg - 63 17 99 %  01/02/14 1515 112/80 mmHg - (!) 56 13 99 %  01/02/14 1500 134/77 mmHg - (!) 57 15 98 %  01/02/14 1442 132/70 mmHg 97.4 F (36.3 C) 65 15 100 %    @flow {1959:LAST@   Intake/Output from previous day:   11/23 0701 - 11/24 0700 In: 1825 [I.V.:1825] Out: 325 [Urine:250]   Intake/Output this shift:       Intake/Output      11/23 0701 - 11/24 0700 11/24 0701 - 11/25 0700   I.V. (mL/kg) 1825 (26.1)    Total Intake(mL/kg) 1825 (26.1)    Urine (mL/kg/hr) 250    Blood 75    Total Output 325     Net +1500          Urine Occurrence 1 x 1 x      LABORATORY DATA:  Recent Labs  01/03/14 0537  WBC 10.3  HGB 11.7*  HCT 35.4*  PLT 220    Recent Labs  01/03/14 0537  NA 142  K 4.8  CL 110  CO2 21  BUN  28*  CREATININE 1.26*  GLUCOSE 113*  CALCIUM 8.5   Lab Results  Component Value Date   INR 1.04 01/02/2014   INR 1.95* 12/22/2013   INR 0.93 05/23/2013    Examination:  General appearance: alert, cooperative and no distress Extremities: extremities normal, atraumatic, no cyanosis or edema and Homans sign is negative, no sign of DVT  Wound Exam: clean, dry, intact   Drainage:  Scant/small amount Serosanguinous exudate  Motor Exam: EHL and FHL Intact  Sensory Exam: Deep Peroneal normal   Assessment:    1 Day Post-Op  Procedure(s) (LRB): RIGHT TOTAL KNEE ARTHROPLASTY (Right)  ADDITIONAL DIAGNOSIS:  Active Problems:   S/P total knee arthroplasty  Acute Blood Loss Anemia  Plan: Physical Therapy as ordered Weight Bearing as Tolerated (WBAT)  DVT Prophylaxis: xarelto  DISCHARGE PLAN: Home  DISCHARGE NEEDS: HHPT, CPM, Walker and 3-in-1 comode seat         Sunnie Odden 01/03/2014, 1:13 PM

## 2014-01-03 NOTE — Discharge Instructions (Signed)
Diet: As you were doing prior to hospitalization   Activity:  Increase activity slowly as tolerated                  No lifting or driving for 6 weeks  Shower:  May shower without a dressing once there is no drainage from your wound.                 Do NOT wash over the wound.                 Dressing:  You may change your dressing on Wednesday                    Then change the dressing daily with sterile 4"x4"s gauze dressing                     And TED hose for knees.  Weight Bearing:  Weight bearing as tolerated as taught in physical therapy.  Use a                                walker or Crutches as instructed.  To prevent constipation: you may use a stool softener such as -               Colace ( over the counter) 100 mg by mouth twice a day                Drink plenty of fluids ( prune juice may be helpful) and high fiber foods                Miralax ( over the counter) for constipation as needed.    Precautions:  If you experience chest pain or shortness of breath - call 911 immediately               For transfer to the hospital emergency department!!               If you develop a fever greater that 101 F, purulent drainage from wound,                             increased redness or drainage from wound, or calf pain -- Call the office.  Follow- Up Appointment:  Please call for an appointment to be seen on 01/17/14                                              [facility] office:  [phone]            [address] [place], [place] [address]

## 2014-01-03 NOTE — Progress Notes (Signed)
Physical Therapy Treatment Patient Details Name: ALESI ZACHERY MRN: [id number] DOB: Aug 28, 1940 Today's Date: 01/03/2014    History of Present Illness RTKA     PT Comments    Continuing progress with functional mobility, stair training done ; OK for dc home from PT standpoint   Follow Up Recommendations  Home health PT;Supervision/Assistance - 24 hour     Equipment Recommendations  Rolling walker with 5" wheels (youth-sized RW)    Recommendations for Other Services OT consult     Precautions / Restrictions Precautions Precautions: Knee Precaution Comments: Pt educated to not allow any pillow or bolster under knee for healing with optimal range of motion.  Restrictions RLE Weight Bearing: Weight bearing as tolerated    Mobility  Bed Mobility Overal bed mobility: Needs Assistance Bed Mobility: Supine to Sit     Supine to sit: Supervision     General bed mobility comments: Cues for technique  Transfers Overall transfer level: Needs assistance Equipment used: Rolling walker (2 wheeled) Transfers: Sit to/from Stand Sit to Stand: Supervision         General transfer comment: Cues for technique, safety, and hand placement  Ambulation/Gait Ambulation/Gait assistance: Supervision Ambulation Distance (Feet): 260 Feet Assistive device: Rolling walker (2 wheeled) Gait Pattern/deviations: Step-through pattern     General Gait Details:  cues to activate R quad in stance for stability   Stairs Stairs: Yes   Stair Management: No rails;Backwards;With walker Number of Stairs: 1 General stair comments: verbal and demo cues for technique  Wheelchair Mobility    Modified Rankin (Stroke Patients Only)       Balance                                    Cognition Arousal/Alertness: Awake/alert Behavior During Therapy: WFL for tasks assessed/performed Overall Cognitive Status: Within Functional Limits for tasks assessed                       Exercises Total Joint Exercises Quad Sets: AROM;Right;15 reps Heel Slides: AROM;AAROM;Right;10 reps Straight Leg Raises: AROM;AAROM;Right;10 reps    General Comments        Pertinent Vitals/Pain Pain Assessment: 0-10 Pain Score: 7  Pain Location: R knee with increasing amb Pain Descriptors / Indicators: Aching Pain Intervention(s): Monitored during session;Premedicated before session    Home Living                      Prior Function            PT Goals (current goals can now be found in the care plan section) Acute Rehab PT Goals Patient Stated Goal: to get back to driving PT Goal Formulation: With patient Time For Goal Achievement: 01/10/14 Potential to Achieve Goals: Good    Frequency  7X/week    PT Plan      Co-evaluation             End of Session Equipment Utilized During Treatment: Gait belt Activity Tolerance: Patient tolerated treatment well Patient left: in chair;with call bell/phone within reach     Time: 1521-1625 (minus approx 10 mins on the toilet) PT Time Calculation (min) (ACUTE ONLY): 64 min  Charges:  $Gait Training: 23-37 mins $Therapeutic Exercise: 8-22 mins $Therapeutic Activity: 8-22 mins                    G Codes:  Roney Marion [facility] 01/03/2014, 5:16 PM  Roney Marion, [place] Pager [phone] Office [phone]

## 2014-01-03 NOTE — Progress Notes (Signed)
OT Cancellation Note  Patient Details Name: Sarah Williamson MRN: [id number] DOB: 12/19/1940   Cancelled Treatment:    Reason Eval/Treat Not Completed: OT screened, no needs identified, will sign off.   [place] M   Cyndie Chime, OTR/L Occupational Therapist [phone] (pager)  01/03/2014, 11:58 AM

## 2014-01-03 NOTE — Op Note (Signed)
TOTAL KNEE REPLACEMENT OPERATIVE NOTE:  01/02/2014  9:40 AM  PATIENT:  Sarah Williamson  73 y.o. female  PRE-OPERATIVE DIAGNOSIS:  osteoarthritis right knee  POST-OPERATIVE DIAGNOSIS:  osteoarthritis right knee  PROCEDURE:  Procedure(s): RIGHT TOTAL KNEE ARTHROPLASTY  SURGEON:  Surgeon(s): Vickey Huger, MD  PHYSICIAN ASSISTANT: Carlynn Spry, [facility]  ANESTHESIA:   spinal  DRAINS: Hemovac  SPECIMEN: None  COUNTS:  Correct  TOURNIQUET:  * Missing tourniquet times found for documented tourniquets in log:  [id number] *  DICTATION:  Indication for procedure:    The patient is a 73 y.o. female who has failed conservative treatment for osteoarthritis right knee.  Informed consent was obtained prior to anesthesia. The risks versus benefits of the operation were explain and in a way the patient can, and did, understand.   On the implant demand matching protocol, this patient scored 8.  Therefore, this patient was not receive a polyethylene insert with vitamin E which is a high demand implant.  Description of procedure:     The patient was taken to the operating room and placed under anesthesia.  The patient was positioned in the usual fashion taking care that all body parts were adequately padded and/or protected.  I foley catheter was not placed.  A tourniquet was applied and the leg prepped and draped in the usual sterile fashion.  The extremity was exsanguinated with the esmarch and tourniquet inflated to 350 mmHg.  Pre-operative range of motion was 0-110 degrees flexion.  The knee was in 5 degree of mild varus.  A midline incision approximately 6-7 inches long was made with a #10 blade.  A new blade was used to make a parapatellar arthrotomy going 2-3 cm into the quadriceps tendon, over the patella, and alongside the medial aspect of the patellar tendon.  A synovectomy was then performed with the #10 blade and forceps. I then elevated the deep MCL off the medial tibial metaphysis  subperiosteally around to the semimembranosus attachment.    I everted the patella and used calipers to measure patellar thickness.  I used the reamer to ream down to appropriate thickness to recreate the native thickness.  I then removed excess bone with the rongeur and sagittal saw.  I used the appropriately sized template and drilled the three lug holes.  I then put the trial in place and measured the thickness with the calipers to ensure recreation of the native thickness.  The trial was then removed and the patella subluxed and the knee brought into flexion.  A homan retractor was place to retract and protect the patella and lateral structures.  A Z-retractor was place medially to protect the medial structures.  The extra-medullary alignment system was used to make cut the tibial articular surface perpendicular to the anamotic axis of the tibia and in 3 degrees of posterior slope.  The cut surface and alignment jig was removed.  I then used the intramedullary alignment guide to make a 6 valgus cut on the distal femur.  I then marked out the epicondylar axis on the distal femur.  The posterior condylar axis measured 3 degrees.  I then used the anterior referencing sizer and measured the femur to be a size 3.  The 4-In-1 cutting block was screwed into place in external rotation matching the posterior condylar angle, making our cuts perpendicular to the epicondylar axis.  Anterior, posterior and chamfer cuts were made with the sagittal saw.  The cutting block and cut pieces were removed.  A lamina  spreader was placed in 90 degrees of flexion.  The ACL, PCL, menisci, and posterior condylar osteophytes were removed.  A 12 mm spacer blocked was found to offer good flexion and extension gap balance after mild in degree releasing.   The scoop retractor was then placed and the femoral finishing block was pinned in place.  The small sagittal saw was used as well as the lug drill to finish the femur.  The block  and cut surfaces were removed and the medullary canal hole filled with autograft bone from the cut pieces.  The tibia was delivered forward in deep flexion and external rotation.  A size B tray was selected and pinned into place centered on the medial 1/3 of the tibial tubercle.  The reamer and keel was used to prepare the tibia through the tray.    I then trialed with the size 3 femur, size B tibia, a 12 mm insert and the 30 patella.  I had excellent flexion/extension gap balance, excellent patella tracking.  Flexion was full and beyond 120 degrees; extension was zero.  These components were chosen and the staff opened them to me on the back table while the knee was lavaged copiously and the cement mixed.  The soft tissue was infiltrated with 60cc of exparel 1.3% through a 21 gauge needle.  I cemented in the components and removed all excess cement.  The polyethylene tibial component was snapped into place and the knee placed in extension while cement was hardening.  The capsule was infilltrated with 30cc of .25% Marcaine with epinephrine.  A hemovac was place in the joint exiting superolaterally.  A pain pump was place superomedially superficial to the arthrotomy.  Once the cement was hard, the tourniquet was let down.  Hemostasis was obtained.  The arthrotomy was closed with figure-8 #1 vicryl sutures.  The deep soft tissues were closed with #0 vicryls and the subcuticular layer closed with a running #2-0 vicryl.  The skin was reapproximated and closed with skin staples.  The wound was dressed with xeroform, 4 x4's, 2 ABD sponges, a single layer of webril and a TED stocking.   The patient was then awakened, extubated, and taken to the recovery room in stable condition.  BLOOD LOSS:  300cc DRAINS: 1 hemovac, 1 pain catheter COMPLICATIONS:  None.  PLAN OF CARE: Admit to inpatient   PATIENT DISPOSITION:  PACU - hemodynamically stable.   Delay start of Pharmacological VTE agent (>24hrs) due to  surgical blood loss or risk of bleeding:  not applicable  Please fax a copy of this op note to my office at [phone] (please only include page 1 and 2 of the Case Information op note)

## 2014-01-03 NOTE — Anesthesia Postprocedure Evaluation (Signed)
  Anesthesia Post-op Note  Patient: Sarah Williamson  Procedure(s) Performed: Procedure(s): RIGHT TOTAL KNEE ARTHROPLASTY (Right)  Patient Location: PACU  Anesthesia Type: Spinal   Level of Consciousness: awake, alert  and oriented  Airway and Oxygen Therapy: Patient Spontanous Breathing  Post-op Pain: mild  Post-op Assessment: Post-op Vital signs reviewed  Post-op Vital Signs: Reviewed  Last Vitals:  Filed Vitals:   01/03/14 1252  BP: 153/66  Pulse: 73  Temp: 37.2 C  Resp: 16    Complications: No apparent anesthesia complications

## 2014-01-03 NOTE — Care Management Note (Signed)
CARE MANAGEMENT NOTE 01/03/2014  Patient:  Sarah Williamson, Sarah Williamson   Account Number:  [id number]  Date Initiated:  01/03/2014  Documentation initiated by:  Ricki Miller  Subjective/Objective Assessment:   73 yr old female admitted with right knee osteoarthritis. Patient had a right total knee arthroplasty.     Action/Plan:   Patient was preoperatively setup with [facility], no changes. patient has family support at discharge. Has 3in1.   Anticipated DC Date:  01/04/2014   Anticipated DC Plan:  [place]  CM consult      [facility] Choice  HOME HEALTH  DURABLE MEDICAL EQUIPMENT   Choice offered to / List presented to:  C-1 Patient   DME arranged  [place]      DME agency  [employer]     [place] arranged  HH-2 PT      [place]   Status of service:  Completed, signed off Medicare Important Message given?  NA - LOS <3 / Initial given by admissions (If response is "NO", the following Medicare IM given date fields will be blank) Date Medicare IM given:   Medicare IM given by:   Date Additional Medicare IM given:   Additional Medicare IM given by:    Discharge Disposition:  [place]  Per UR Regulation:  Reviewed for med. necessity/level of care/duration of stay

## 2014-01-03 NOTE — Evaluation (Signed)
Physical Therapy Evaluation Patient Details Name: Sarah Williamson MRN: [id number] DOB: 02-07-1941 Today's Date: 01/03/2014   History of Present Illness  RTKA  Past Medical History  Diagnosis Date  . Hypertension   . Asymmetric septal hypertrophy     WITH MILD LVOT OBSTRUCTION  . History of breast cancer   . 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 Chest pain, unspecified   . Hot flashes   . Cancer   . PONV (postoperative nausea and vomiting)   . Shortness of breath dyspnea     exertion  . Heart murmur     sees nasher  . Personal history of PE (pulmonary embolism) 2015  . Arthritis   . History of blood transfusion     due to chemo   Past Surgical History  Procedure Laterality Date  . Btl      AND 2 MISCARRIAGES; rotator cuff last june  . Rotator cuff repair Right   . Mastectomy Bilateral     7 year ago; lymph nodes removed right side  . Tubal ligation    . Eye surgery Bilateral     cataracts     Clinical Impression  Pt is s/p TKA resulting in the deficits listed below (see PT Problem List).  Pt will benefit from skilled PT to increase their independence and safety with mobility to allow discharge to the venue listed below.      Follow Up Recommendations Home health PT;Supervision/Assistance - 24 hour    Equipment Recommendations  Rolling walker with 5" wheels (youth-sized RW)    Recommendations for Other Services OT consult     Precautions / Restrictions Precautions Precautions: Knee Precaution Comments: Pt educated to not allow any pillow or bolster under knee for healing with optimal range of motion.  Restrictions RLE Weight Bearing: Weight bearing as tolerated      Mobility  Bed Mobility                  Transfers Overall transfer level: Needs assistance Equipment used: Rolling walker (2 wheeled) Transfers: Sit to/from Stand Sit to Stand: Min assist         General transfer comment: Cues for technique, safety, and hand  placement  Ambulation/Gait Ambulation/Gait assistance: Min guard;Supervision Ambulation Distance (Feet): 100 Feet Assistive device: Rolling walker (2 wheeled) Gait Pattern/deviations: Step-through pattern     General Gait Details: minguard assist progressing to supervision; cues to activate R quad in stance for stability  Stairs            Wheelchair Mobility    Modified Rankin (Stroke Patients Only)       Balance                                             Pertinent Vitals/Pain Pain Assessment: 0-10 Pain Score: 5  Pain Location: R knee Pain Descriptors / Indicators: Aching Pain Intervention(s): Monitored during session    Home Living Family/patient expects to be discharged to:: Private residence Living Arrangements: Spouse/significant other Available Help at Discharge: Family;Available 24 hours/day Type of Home: House Home Access: Stairs to enter Entrance Stairs-Rails: None Entrance Stairs-Number of Steps: 1 Home Layout: One level Home Equipment: None      Prior Function Level of Independence: Independent               Hand Dominance  Extremity/Trunk Assessment   Upper Extremity Assessment: Overall WFL for tasks assessed           Lower Extremity Assessment: RLE deficits/detail RLE Deficits / Details: Grossly decr AROM and strength, limited by pain postop; Noted good quad activation in stance       Communication   Communication: No difficulties  Cognition Arousal/Alertness: Awake/alert Behavior During Therapy: WFL for tasks assessed/performed Overall Cognitive Status: Within Functional Limits for tasks assessed                      General Comments      Exercises        Assessment/Plan    PT Assessment Patient needs continued PT services  PT Diagnosis Difficulty walking;Acute pain   PT Problem List Decreased strength;Decreased range of motion;Decreased activity tolerance;Decreased  mobility;Decreased knowledge of use of DME;Pain  PT Treatment Interventions DME instruction;Gait training;Stair training;Functional mobility training;Therapeutic activities;Therapeutic exercise;Balance training;Patient/family education   PT Goals (Current goals can be found in the Care Plan section) Acute Rehab PT Goals Patient Stated Goal: to get back to driving PT Goal Formulation: With patient Time For Goal Achievement: 01/10/14 Potential to Achieve Goals: Good    Frequency 7X/week   Barriers to discharge        Co-evaluation               End of Session Equipment Utilized During Treatment: Gait belt Activity Tolerance: Patient tolerated treatment well Patient left: in chair;with call bell/phone within reach Nurse Communication: Mobility status         Time: 0092-3300 PT Time Calculation (min) (ACUTE ONLY): 48 min   Charges:   PT Evaluation $Initial PT Evaluation Tier I: 1 Procedure PT Treatments $Gait Training: 23-37 mins $Therapeutic Activity: 8-22 mins   PT G Codes:          Quin Hoop 01/03/2014, 12:16 PM  Roney Marion, [place] Pager [phone] Office [phone]

## 2014-01-03 NOTE — Plan of Care (Signed)
Problem: Phase I Progression Outcomes Goal: CMS/Neurovascular status WDL Outcome: Completed/Met Date Met:  01/03/14 Goal: Pain controlled with appropriate interventions Outcome: Completed/Met Date Met:  01/03/14 Goal: Dangle or out of bed evening of surgery Outcome: Completed/Met Date Met:  01/03/14 Goal: Initial discharge plan identified Outcome: Completed/Met Date Met:  01/03/14 Goal: Hemodynamically stable Outcome: Completed/Met Date Met:  01/03/14  Problem: Phase III Progression Outcomes Goal: Pain controlled on oral analgesia Outcome: Completed/Met Date Met:  01/03/14 Goal: Ambulates Outcome: Completed/Met Date Met:  01/03/14 Goal: Incision clean - minimal/no drainage Outcome: Completed/Met Date Met:  01/03/14 Goal: Discharge plan remains appropriate-arrangements made Outcome: Completed/Met Date Met:  01/03/14 Goal: Anticoagulant follow-up in place Outcome: Not Applicable Date Met:  01/03/14  Problem: Discharge Progression Outcomes Goal: Barriers To Progression Addressed/Resolved Outcome: Completed/Met Date Met:  01/03/14 Goal: CMS/Neurovascular status at or above baseline Outcome: Completed/Met Date Met:  01/03/14 Goal: Anticoagulant follow-up in place Outcome: Not Applicable Date Met:  01/03/14 Goal: Pain controlled with appropriate interventions Outcome: Completed/Met Date Met:  01/03/14 Goal: Hemodynamically stable Outcome: Completed/Met Date Met:  01/03/14 Goal: Complications resolved/controlled Outcome: Completed/Met Date Met:  01/03/14 Goal: Tolerates diet Outcome: Completed/Met Date Met:  01/03/14 Goal: Activity appropriate for discharge plan Outcome: Completed/Met Date Met:  01/03/14 Goal: Ambulates safely using assistive device Outcome: Completed/Met Date Met:  01/03/14 Goal: Follows weight - bearing limitations Outcome: Completed/Met Date Met:  01/03/14 Goal: Discharge plan in place and appropriate Outcome: Completed/Met Date Met:  01/03/14 Goal:  Demonstrates ADLs as appropriate Outcome: Completed/Met Date Met:  01/03/14     

## 2014-01-04 NOTE — Discharge Summary (Signed)
SPORTS MEDICINE & JOINT REPLACEMENT   Lara Mulch, MD   Carlynn Spry, PA-C [place], [place],   [address]                             [phone]  PATIENT ID: Sarah Williamson        MRN:  [id number]          DOB/AGE: 1940/06/24 / 73 y.o.    DISCHARGE SUMMARY  ADMISSION DATE:    01/02/2014 DISCHARGE DATE:   01/03/2014  ADMISSION DIAGNOSIS: osteoarthritis right knee    DISCHARGE DIAGNOSIS:  osteoarthritis right knee    ADDITIONAL DIAGNOSIS: Active Problems:   S/P total knee arthroplasty  Past Medical History  Diagnosis Date  . Hypertension   . Asymmetric septal hypertrophy     WITH MILD LVOT OBSTRUCTION  . History of breast cancer   . 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 Chest pain, unspecified   . Hot flashes   . Cancer   . PONV (postoperative nausea and vomiting)   . Shortness of breath dyspnea     exertion  . Heart murmur     sees nasher  . Personal history of PE (pulmonary embolism) 2015  . Arthritis   . History of blood transfusion     due to chemo    PROCEDURE: Procedure(s): RIGHT TOTAL KNEE ARTHROPLASTY on 01/02/2014  CONSULTS:     HISTORY:  See H&P in chart  HOSPITAL COURSE:  LORIANA SAMAD is a 73 y.o. admitted on 01/02/2014 and found to have a diagnosis of osteoarthritis right knee.  After appropriate laboratory studies were obtained  they were taken to the operating room on 01/02/2014 and underwent Procedure(s): RIGHT TOTAL KNEE ARTHROPLASTY.   They were given perioperative antibiotics:  Anti-infectives    Start     Dose/Rate Route Frequency Ordered Stop   01/02/14 1915  vancomycin (VANCOCIN) IVPB 1000 mg/200 mL premix     1,000 mg200 mL/hr over 60 Minutes Intravenous Every 12 hours 01/02/14 1907 01/03/14 0131   01/02/14 0926  vancomycin (VANCOCIN) 1 GM/200ML IVPB    Comments:  Starleen Arms   : cabinet override      01/02/14 0926 01/02/14 1133   01/01/14 1150  vancomycin (VANCOCIN) IVPB 1000 mg/200 mL  premix  Status:  Discontinued     1,000 mg200 mL/hr over 60 Minutes Intravenous On call to O.R. 01/01/14 1150 01/02/14 1907    .  Tolerated the procedure well.  Placed with a foley intraoperatively.  Given Ofirmev at induction and for 48 hours.    POD# 1: Vital signs were stable.  Patient denied Chest pain, shortness of breath, or calf pain.  Patient was started on Lovenox 30 mg subcutaneously twice daily at 8am.  Consults to PT, OT, and care management were made.  The patient was weight bearing as tolerated.  CPM was placed on the operative leg 0-90 degrees for 6-8 hours a day.  Incentive spirometry was taught.  Dressing was changed.  Hemovac was discontinued.      POD #2, Continued  PT for ambulation and exercise program.  IV saline locked.  O2 discontinued.    The remainder of the hospital course was dedicated to ambulation and strengthening.   The patient was discharged on 1 day post op in  Good condition.  Blood products given:none  DIAGNOSTIC STUDIES: Recent vital signs: No data found.  Recent laboratory studies:  Recent Labs  01/03/14 0537  WBC 10.3  HGB 11.7*  HCT 35.4*  PLT 220    Recent Labs  01/03/14 0537  NA 142  K 4.8  CL 110  CO2 21  BUN 28*  CREATININE 1.26*  GLUCOSE 113*  CALCIUM 8.5   Lab Results  Component Value Date   INR 1.04 01/02/2014   INR 1.95* 12/22/2013   INR 0.93 05/23/2013     Recent Radiographic Studies :  No results found.  DISCHARGE INSTRUCTIONS: Discharge Instructions    CPM    Complete by:  As directed   Continuous passive motion machine (CPM):      Use the CPM from 0 to 90 for 6-8 hours per day.      You may increase by 10 per day.  You may break it up into 2 or 3 sessions per day.      Use CPM for 2 weeks or until you are told to stop.     Call MD / Call 911    Complete by:  As directed   If you experience chest pain or shortness of breath, CALL 911 and be transported to the hospital emergency room.  If you  develope a fever above 101 F, pus (white drainage) or increased drainage or redness at the wound, or calf pain, call your surgeon's office.     Change dressing    Complete by:  As directed   Change dressing on wednesday, then change the dressing daily with sterile 4 x 4 inch gauze dressing and apply TED hose.     Constipation Prevention    Complete by:  As directed   Drink plenty of fluids.  Prune juice may be helpful.  You may use a stool softener, such as Colace (over the counter) 100 mg twice a day.  Use MiraLax (over the counter) for constipation as needed.     Diet - low sodium heart healthy    Complete by:  As directed      Do not put a pillow under the knee. Place it under the heel.    Complete by:  As directed      Driving restrictions    Complete by:  As directed   No driving for 6 weeks     Increase activity slowly as tolerated    Complete by:  As directed      Lifting restrictions    Complete by:  As directed   No lifting for 6 weeks     TED hose    Complete by:  As directed   Use stockings (TED hose) for 2 weeks on both leg(s).  You may remove them at night for sleeping.           DISCHARGE MEDICATIONS:     Medication List    TAKE these medications        acetaminophen 500 MG tablet  Commonly known as:  TYLENOL  Take 1,000 mg by mouth daily as needed.     lisinopril 40 MG tablet  Commonly known as:  PRINIVIL,ZESTRIL  Take 1 tablet (40 mg total) by mouth daily.     methocarbamol 500 MG tablet  Commonly known as:  ROBAXIN  Take 1-2 tablets (500-1,000 mg total) by mouth every 6 (six) hours as needed for muscle spasms.     metoprolol succinate 100 MG 24 hr tablet  Commonly known as:  TOPROL-XL  Take 1 tablet (100 mg total) by mouth  daily. Take with or immediately following a meal.     MULTIVITAMIN PO  Take 1 tablet by mouth daily.     oxyCODONE 5 MG immediate release tablet  Commonly known as:  Oxy IR/ROXICODONE  Take 1-2 tablets (5-10 mg total) by mouth  every 4 (four) hours as needed for breakthrough pain.     OxyCODONE 10 mg T12a 12 hr tablet  Commonly known as:  OXYCONTIN  Take 1 tablet (10 mg total) by mouth every 12 (twelve) hours.     rivaroxaban 20 MG Tabs tablet  Commonly known as:  XARELTO  Take 20 mg by mouth daily with supper.     rivaroxaban 20 MG Tabs tablet  Commonly known as:  XARELTO  Take 1 tablet (20 mg total) by mouth daily with supper.     traMADol 50 MG tablet  Commonly known as:  ULTRAM  Take 50 mg by mouth every 6 (six) hours as needed (pain).        FOLLOW UP VISIT:       Follow-up Information    Follow up with Rudean Haskell, MD On 01/17/2014.   Specialty:  Orthopedic Surgery   Contact information:   [address] [place] [address] [phone]       DISPOSITION: HOME  CONDITION:  Good   Ameila Weldon 01/04/2014, 5:31 PM

## 2014-03-31 ENCOUNTER — Other Ambulatory Visit: Payer: Self-pay

## 2014-03-31 DIAGNOSIS — R011 Cardiac murmur, unspecified: Secondary | ICD-10-CM

## 2014-03-31 DIAGNOSIS — I10 Essential (primary) hypertension: Secondary | ICD-10-CM

## 2014-03-31 MED ORDER — LISINOPRIL 40 MG PO TABS: 40.0000 mg | ORAL_TABLET | Freq: Every day | ORAL | Status: DC

## 2014-11-13 ENCOUNTER — Ambulatory Visit (INDEPENDENT_AMBULATORY_CARE_PROVIDER_SITE_OTHER): Payer: Medicare Other | Admitting: Cardiovascular Disease

## 2014-11-13 ENCOUNTER — Encounter: Payer: Self-pay | Admitting: Cardiovascular Disease

## 2014-11-13 VITALS — BP 154/80 | HR 71 | Ht <= 58 in | Wt 155.8 lb

## 2014-11-13 DIAGNOSIS — I10 Essential (primary) hypertension: Secondary | ICD-10-CM | POA: Diagnosis not present

## 2014-11-13 DIAGNOSIS — R011 Cardiac murmur, unspecified: Secondary | ICD-10-CM | POA: Diagnosis not present

## 2014-11-13 MED ORDER — CARVEDILOL 25 MG PO TABS: 25.0000 mg | ORAL_TABLET | Freq: Two times a day (BID) | ORAL | Status: DC

## 2014-11-13 NOTE — Patient Instructions (Signed)
Medication Instructions:  STOP Metoprolol START Carvedilol 25 mg twice daily - take 12 hours apart   Labwork: None Ordered    Testing/Procedures: Your physician has requested that you regularly monitor and record your blood pressure readings at home. Please use the same machine at the same time of day to check your readings and call Christen Bame, RN at [phone] to report.    Follow-Up: Your physician wants you to follow-up in: 6 months with Dr. Acie Fredrickson.  You will receive a reminder letter in the mail two months in advance. If you don't receive a letter, please call our office to schedule the follow-up appointment.

## 2014-11-13 NOTE — Progress Notes (Signed)
Sarah Williamson Date of Birth  January 31, 1941       [facility] Office [address], [place], [place]  [address]   [place], [place]  [address] [phone]     [phone]   Fax  [phone]    Fax [phone]  Problem List: 1. Hypertension 2. 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 History breast cancer 4. Spinal stenosis 5. Pulmonary embolus- treated with Xarelto  History of Present Illness:  Sarah Williamson is doing well,  We  decreased her HCTZ and she  is doing well.  She still does not get much exercise.  Oct. 14, 2014:  Sarah Williamson is doing better.  She had right rotator cuff surgery and has been in rehab for most of the summer.   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    No dyspnea.    She takes her bp at home and typically get 130/ 82.    OCt. 1, 2015:  Sarah Williamson  was hospitalized in May for a pulmonary embolus. She was started on Xarelto.  She saw [name] after the hospitalization. She did not have any significant ortho problems prior to her PE.   She had been taken off her ASA ( due to gout problems)   It appears that this was an unprovoked PE.   Oct. 3, 2016:  Sarah Williamson is doing well BP has been a bit high. She had an unprovoked pulmonary embolis .  Took xarelto for 6 months.   Was managed by oncology / hemotology .     Current Outpatient Prescriptions on File Prior to Visit  Medication Sig Dispense Refill  . acetaminophen (TYLENOL) 500 MG tablet Take 1,000 mg by mouth every 4 (four) hours as needed for mild pain or headache.     . lisinopril (PRINIVIL,ZESTRIL) 40 MG tablet Take 1 tablet (40 mg total) by mouth daily. 90 tablet 1  . metoprolol succinate (TOPROL-XL) 100 MG 24 hr tablet Take 1 tablet (100 mg total) by mouth daily. Take with or immediately following a meal. 90 tablet 3  . traMADol (ULTRAM) 50 MG tablet Take 50 mg by mouth every 6 (six) hours as needed (pain).       No current facility-administered medications on file prior to visit.    Allergies  Allergen Reactions  . Penicillins Other (See Comments)    Occurred as a child; unknown reaction    Past Medical History  Diagnosis Date  . Hypertension   . Asymmetric septal hypertrophy (HCC)     WITH MILD LVOT OBSTRUCTION  . History of breast cancer   . 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 Chest pain, unspecified   . Hot flashes   . Cancer ([place])   . PONV (postoperative nausea and vomiting)   . Shortness of breath dyspnea     exertion  . Heart murmur     sees nasher  . Personal history of PE (pulmonary embolism) 2015  . Arthritis   . History of blood transfusion     due to chemo    Past Surgical History  Procedure Laterality Date  . Btl      AND 2 MISCARRIAGES; rotator cuff last june  . Rotator cuff repair Right   . Mastectomy Bilateral     7 year ago; lymph nodes removed right side  . Tubal ligation    .  Eye surgery Bilateral     cataracts  . Total knee arthroplasty Right 01/02/2014    Procedure: RIGHT TOTAL KNEE ARTHROPLASTY;  Surgeon: Vickey Huger, MD;  Location: [facility];  Service: Orthopedics;  Laterality: Right;    History  Smoking status  . Never Smoker   Smokeless tobacco  . Not on file    History  Alcohol Use No    Family History  Problem Relation Age of Onset  . Hypertension Mother   . Heart failure Father   . Heart attack Father   . Diabetes Maternal Grandmother   . Diabetes Maternal Grandfather   . Heart attack Paternal Grandmother     Reviw of Systems:  Reviewed in the HPI.  All other systems are negative.  Physical Exam: Blood pressure 154/80, pulse 71, height 4\' 8"  (1.422 m), weight 70.67 kg (155 lb 12.8 oz). General: Well developed, well nourished, in no acute distress.    Head: Normocephalic, atraumatic, sclera non-icteric, mucus membranes are moist,   Neck: Supple. Carotids are 2 + without bruits. No JVD  Lungs: Clear  bilaterally to auscultation.  Heart: regular rate.  normal  S1 S2. She is a very soft systolic murmur  Abdomen: Soft, non-tender, non-distended with normal bowel sounds. No hepatomegaly. No rebound/guarding. No masses.  Msk:  Strength and tone are normal  Extremities: No clubbing or cyanosis. trace edema .   Distal pedal pulses are 2+ and equal bilaterally.  Neuro: Alert and oriented X 3. Moves all extremities spontaneously.  Psych:  Responds to questions appropriately with a normal affect.  ECG: Oct. 3, 2016:  NSR at 71,  Mild NS ST abn. Possibly due to her elevated BP   Assessment / Plan:   1. Hypertension - still eats some additional salt.  We tried her on HCTZ in the past but it caused a  Gout attack.    In an effort to reduce her blood pressure we will stop the metoprolol and start her on carvedilol 25 mg twice a day. She will check her blood pressure at [facility] on occasion.  2. 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 History breast cancer 4. Spinal stenosis 5. Pulmonary embolus- treated with Xarelto.    This was an unprovoked pulmonary embolus .   I think some consideration should be given to having her on livelong anticoagulation  . She will check with her hematologist .      Thayer Headings, MD  11/13/2014 10:03 AM    [address],  [place], [place]  [address] Pager [phone] Phone: [phone]; Fax: [phone]   [facility]  [address] [place], [place]  [address] [phone]   Fax [phone]

## 2015-01-08 ENCOUNTER — Other Ambulatory Visit: Payer: Self-pay | Admitting: Cardiovascular Disease

## 2015-02-13 ENCOUNTER — Other Ambulatory Visit: Payer: Self-pay

## 2015-02-13 NOTE — Telephone Encounter (Signed)
Approved      Disp Refills Start End    lisinopril (PRINIVIL,ZESTRIL) 40 MG tablet 90 tablet 3 01/09/2015     Sig:  TAKE ONE TABLET BY MOUTH ONCE DAILY    Class:  Normal    DAW:  No    Authorizing Provider:  Thayer Headings, MD    Ordering User:  Juventino Slovak, CMA

## 2015-02-20 ENCOUNTER — Other Ambulatory Visit: Payer: Self-pay | Admitting: *Deleted

## 2015-02-20 ENCOUNTER — Telehealth: Payer: Self-pay | Admitting: Cardiovascular Disease

## 2015-02-20 DIAGNOSIS — R011 Cardiac murmur, unspecified: Secondary | ICD-10-CM

## 2015-02-20 DIAGNOSIS — I10 Essential (primary) hypertension: Secondary | ICD-10-CM

## 2015-02-20 MED ORDER — LISINOPRIL 40 MG PO TABS: 40.0000 mg | ORAL_TABLET | Freq: Every day | ORAL | Status: DC

## 2015-02-20 MED ORDER — CARVEDILOL 25 MG PO TABS: 25.0000 mg | ORAL_TABLET | Freq: Two times a day (BID) | ORAL | Status: DC

## 2015-02-20 NOTE — Telephone Encounter (Signed)
New message     Patient calling    *STAT* If patient is at the pharmacy, call can be transferred to refill team.   1. Which medications need to be refilled? (please list name of each medication and dose if known)  Carvedilol 25 mg  & lisinopril 40 mg   2. Which pharmacy/location (including street and city if local pharmacy) is medication to be sent to? Mail order [facility] fax # [phone]  3. Do they need a 30 day or 90 day supply? 90 days

## 2015-05-07 DIAGNOSIS — C50919 Malignant neoplasm of unspecified site of unspecified female breast: Secondary | ICD-10-CM | POA: Diagnosis not present

## 2015-05-07 DIAGNOSIS — I10 Essential (primary) hypertension: Secondary | ICD-10-CM | POA: Diagnosis not present

## 2015-05-07 DIAGNOSIS — M1990 Unspecified osteoarthritis, unspecified site: Secondary | ICD-10-CM | POA: Diagnosis not present

## 2015-05-07 DIAGNOSIS — Z0000 Encounter for general adult medical examination without abnormal findings: Secondary | ICD-10-CM | POA: Diagnosis not present

## 2015-05-07 DIAGNOSIS — M549 Dorsalgia, unspecified: Secondary | ICD-10-CM | POA: Diagnosis not present

## 2015-05-07 DIAGNOSIS — E782 Mixed hyperlipidemia: Secondary | ICD-10-CM | POA: Diagnosis not present

## 2015-05-07 DIAGNOSIS — Z86711 Personal history of pulmonary embolism: Secondary | ICD-10-CM | POA: Diagnosis not present

## 2015-05-07 DIAGNOSIS — Z1211 Encounter for screening for malignant neoplasm of colon: Secondary | ICD-10-CM | POA: Diagnosis not present

## 2015-05-07 DIAGNOSIS — N183 Chronic kidney disease, stage 3 (moderate): Secondary | ICD-10-CM | POA: Diagnosis not present

## 2015-05-14 ENCOUNTER — Ambulatory Visit (INDEPENDENT_AMBULATORY_CARE_PROVIDER_SITE_OTHER): Payer: Commercial Managed Care - HMO | Admitting: Cardiovascular Disease

## 2015-05-14 ENCOUNTER — Encounter: Payer: Self-pay | Admitting: Cardiovascular Disease

## 2015-05-14 VITALS — BP 145/76 | HR 70 | Ht <= 58 in | Wt 159.8 lb

## 2015-05-14 DIAGNOSIS — I10 Essential (primary) hypertension: Secondary | ICD-10-CM

## 2015-05-14 NOTE — Progress Notes (Signed)
Sarah Williamson Date of Birth  [birth year]       [facility] Office [address] [place], [place], [place]  [address]   [place], [place]  [address] [phone]     [phone]   Fax  [phone]    Fax [phone]  Problem List: 1. Hypertension 2. 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 History breast cancer 4. Spinal stenosis 5. Pulmonary embolus- treated with Xarelto  History of Present Illness:  Sarah Williamson is doing well,  We  decreased her HCTZ and she  is doing well.  She still does not get much exercise.  Oct. 14, 2014:  Sarah Williamson is doing better.  She had right rotator cuff surgery and has been in rehab for most of the summer.   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    No dyspnea.    She takes her bp at home and typically get 130/ 82.    OCt. 1, 2015:  Sarah Williamson  was hospitalized in May for a pulmonary embolus. She was started on Xarelto.  She saw [name] after the hospitalization. She did not have any significant ortho problems prior to her PE.   She had been taken off her ASA ( due to gout problems)   It appears that this was an unprovoked PE.   Oct. 3, 2016:  Sarah Williamson is doing well BP has been a bit high. She had an unprovoked pulmonary embolis .  Took xarelto for 6 months.   Was managed by oncology / hemotology .   May 14, 2015:  BP is a bit high today . Was elevated this am here in the office.  No CP or dyspnea.    Current Outpatient Prescriptions on File Prior to Visit  Medication Sig Dispense Refill  . acetaminophen (TYLENOL) 500 MG tablet Take 1,000 mg by mouth every 4 (four) hours as needed for mild pain or headache.     . carvedilol (COREG) 25 MG tablet Take 1 tablet (25 mg total) by mouth 2 (two) times daily. 180 tablet 3  . lisinopril (PRINIVIL,ZESTRIL) 40 MG tablet Take 1 tablet (40 mg total) by mouth daily. 90 tablet 3  . traMADol (ULTRAM) 50 MG tablet Take  50 mg by mouth every 6 (six) hours as needed (pain).      No current facility-administered medications on file prior to visit.    Allergies  Allergen Reactions  . Penicillins Other (See Comments)    Occurred as a child; unknown reaction    Past Medical History  Diagnosis Date  . Hypertension   . Asymmetric septal hypertrophy (HCC)     WITH MILD LVOT OBSTRUCTION  . History of breast cancer   . 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 Chest pain, unspecified   . Hot flashes   . Cancer ([facility] [place])   . PONV (postoperative nausea and vomiting)   . Shortness of breath dyspnea     exertion  . Heart murmur     sees nasher  . Personal history of PE (pulmonary embolism) 2015  . Arthritis   . History of blood transfusion     due to chemo    Past Surgical History  Procedure Laterality Date  . Btl      AND 2 MISCARRIAGES; rotator cuff last june  . Rotator cuff repair Right   . Mastectomy Bilateral  7 year ago; lymph nodes removed right side  . Tubal ligation    . Eye surgery Bilateral     cataracts  . Total knee arthroplasty Right 01/02/2014    Procedure: RIGHT TOTAL KNEE ARTHROPLASTY;  Surgeon: Vickey Huger, MD;  Location: [facility];  Service: Orthopedics;  Laterality: Right;    History  Smoking status  . Never Smoker   Smokeless tobacco  . Not on file    History  Alcohol Use No    Family History  Problem Relation Age of Onset  . Hypertension Mother   . Heart failure Father   . Heart attack Father   . Diabetes Maternal Grandmother   . Diabetes Maternal Grandfather   . Heart attack Paternal Grandmother     Reviw of Systems:  Reviewed in the HPI.  All other systems are negative.  Physical Exam: Blood pressure 145/76, pulse 70, height 4\' 8"  (1.422 m), weight 159 lb 12.8 oz (72.485 kg). General: Well developed, well nourished, in no acute distress.    Head: Normocephalic, atraumatic, sclera non-icteric, mucus membranes are moist,   Neck: Supple.  Carotids are 2 + without bruits. No JVD  Lungs: Clear bilaterally to auscultation.  Heart: regular rate.  normal  S1 S2. She is a very soft systolic murmur  Abdomen: Soft, non-tender, non-distended with normal bowel sounds. No hepatomegaly. No rebound/guarding. No masses.  Msk:  Strength and tone are normal  Extremities: No clubbing or cyanosis. trace edema .   Distal pedal pulses are 2+ and equal bilaterally.  Neuro: Alert and oriented X 3. Moves all extremities spontaneously.  Psych:  Responds to questions appropriately with a normal affect.  ECG: Oct. 3, 2016:  NSR at 72,  Mild NS ST abn. Possibly due to her elevated BP   Assessment / Plan:   1. Hypertension -BP has been good at home .    Continue same meds   2. 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 History breast cancer 4. Spinal stenosis 5. Pulmonary embolus- treated with Xarelto.    This was an unprovoked pulmonary embolus .   I think some consideration should be given to having her on livelong anticoagulation  .   She has not had any anticoagulation for two years.  She has discussed this with her Oncologist.        Nahser, Wonda Cheng, MD  05/14/2015 10:59 AM    [address] [place],  [place], [place]  [address] Pager [phone] Phone: [phone]; Fax: [phone]   [facility]  [address] [place], [place]  [address] [phone]   Fax [phone]

## 2015-05-14 NOTE — Patient Instructions (Signed)
Medication Instructions:  Your physician recommends that you continue on your current medications as directed. Please refer to the Current Medication list given to you today.   Labwork: None Ordered   Testing/Procedures: None Ordered   Follow-Up: Your physician wants you to follow-up in: 1 year with Dr. Nahser.  You will receive a reminder letter in the mail two months in advance. If you don't receive a letter, please call our office to schedule the follow-up appointment.   If you need a refill on your cardiac medications before your next appointment, please call your pharmacy.   Thank you for choosing [facility]! Colvin Blatt, RN [phone]    

## 2015-07-31 ENCOUNTER — Ambulatory Visit (HOSPITAL_BASED_OUTPATIENT_CLINIC_OR_DEPARTMENT_OTHER)
Admission: RE | Admit: 2015-07-31 | Discharge: 2015-07-31 | Disposition: A | Discharge status: Home / Self Care | Payer: Commercial Managed Care - HMO | Source: Ambulatory Visit | Attending: Family Medicine | Admitting: Family Medicine

## 2015-07-31 ENCOUNTER — Other Ambulatory Visit (HOSPITAL_BASED_OUTPATIENT_CLINIC_OR_DEPARTMENT_OTHER): Payer: Self-pay | Admitting: Family Medicine

## 2015-07-31 DIAGNOSIS — R609 Edema, unspecified: Secondary | ICD-10-CM | POA: Diagnosis not present

## 2015-07-31 DIAGNOSIS — M7121 Synovial cyst of popliteal space [Baker], right knee: Secondary | ICD-10-CM | POA: Diagnosis not present

## 2015-07-31 DIAGNOSIS — M79604 Pain in right leg: Secondary | ICD-10-CM

## 2015-07-31 DIAGNOSIS — M7989 Other specified soft tissue disorders: Principal | ICD-10-CM

## 2015-07-31 DIAGNOSIS — R600 Localized edema: Secondary | ICD-10-CM | POA: Diagnosis not present

## 2015-07-31 DIAGNOSIS — N183 Chronic kidney disease, stage 3 (moderate): Secondary | ICD-10-CM | POA: Diagnosis not present

## 2015-08-13 ENCOUNTER — Other Ambulatory Visit: Payer: Self-pay | Admitting: Gastroenterology

## 2015-08-13 DIAGNOSIS — Z1211 Encounter for screening for malignant neoplasm of colon: Secondary | ICD-10-CM | POA: Diagnosis not present

## 2015-08-13 DIAGNOSIS — D126 Benign neoplasm of colon, unspecified: Secondary | ICD-10-CM | POA: Diagnosis not present

## 2015-08-13 DIAGNOSIS — D123 Benign neoplasm of transverse colon: Secondary | ICD-10-CM | POA: Diagnosis not present

## 2015-08-13 DIAGNOSIS — D122 Benign neoplasm of ascending colon: Secondary | ICD-10-CM | POA: Diagnosis not present

## 2015-09-07 DIAGNOSIS — H524 Presbyopia: Secondary | ICD-10-CM | POA: Diagnosis not present

## 2015-09-07 DIAGNOSIS — H5210 Myopia, unspecified eye: Secondary | ICD-10-CM | POA: Diagnosis not present

## 2015-09-12 DIAGNOSIS — C50911 Malignant neoplasm of unspecified site of right female breast: Secondary | ICD-10-CM | POA: Diagnosis not present

## 2015-09-12 DIAGNOSIS — C50912 Malignant neoplasm of unspecified site of left female breast: Secondary | ICD-10-CM | POA: Diagnosis not present

## 2015-10-18 DIAGNOSIS — C50911 Malignant neoplasm of unspecified site of right female breast: Secondary | ICD-10-CM | POA: Diagnosis not present

## 2015-10-18 DIAGNOSIS — C50912 Malignant neoplasm of unspecified site of left female breast: Secondary | ICD-10-CM | POA: Diagnosis not present

## 2015-11-08 DIAGNOSIS — C50919 Malignant neoplasm of unspecified site of unspecified female breast: Secondary | ICD-10-CM | POA: Diagnosis not present

## 2015-11-08 DIAGNOSIS — Z23 Encounter for immunization: Secondary | ICD-10-CM | POA: Diagnosis not present

## 2015-11-08 DIAGNOSIS — M549 Dorsalgia, unspecified: Secondary | ICD-10-CM | POA: Diagnosis not present

## 2015-11-08 DIAGNOSIS — E782 Mixed hyperlipidemia: Secondary | ICD-10-CM | POA: Diagnosis not present

## 2015-11-08 DIAGNOSIS — N183 Chronic kidney disease, stage 3 (moderate): Secondary | ICD-10-CM | POA: Diagnosis not present

## 2015-11-08 DIAGNOSIS — I10 Essential (primary) hypertension: Secondary | ICD-10-CM | POA: Diagnosis not present

## 2015-11-08 DIAGNOSIS — M1990 Unspecified osteoarthritis, unspecified site: Secondary | ICD-10-CM | POA: Diagnosis not present

## 2015-11-08 DIAGNOSIS — Z86711 Personal history of pulmonary embolism: Secondary | ICD-10-CM | POA: Diagnosis not present

## 2015-12-07 DIAGNOSIS — K219 Gastro-esophageal reflux disease without esophagitis: Secondary | ICD-10-CM | POA: Diagnosis not present

## 2016-01-01 DIAGNOSIS — K219 Gastro-esophageal reflux disease without esophagitis: Secondary | ICD-10-CM | POA: Diagnosis not present

## 2016-01-10 ENCOUNTER — Other Ambulatory Visit: Payer: Self-pay | Admitting: Cardiovascular Disease

## 2016-01-10 DIAGNOSIS — I10 Essential (primary) hypertension: Secondary | ICD-10-CM

## 2016-01-10 DIAGNOSIS — R011 Cardiac murmur, unspecified: Secondary | ICD-10-CM

## 2016-01-20 DIAGNOSIS — M109 Gout, unspecified: Secondary | ICD-10-CM | POA: Diagnosis not present

## 2016-05-13 ENCOUNTER — Encounter: Payer: Self-pay | Admitting: Cardiovascular Disease

## 2016-05-29 ENCOUNTER — Ambulatory Visit (INDEPENDENT_AMBULATORY_CARE_PROVIDER_SITE_OTHER): Payer: Medicare HMO | Admitting: Cardiovascular Disease

## 2016-05-29 ENCOUNTER — Encounter: Payer: Self-pay | Admitting: Cardiovascular Disease

## 2016-05-29 ENCOUNTER — Encounter (INDEPENDENT_AMBULATORY_CARE_PROVIDER_SITE_OTHER): Payer: Self-pay

## 2016-05-29 DIAGNOSIS — R011 Cardiac murmur, unspecified: Secondary | ICD-10-CM | POA: Diagnosis not present

## 2016-05-29 DIAGNOSIS — I10 Essential (primary) hypertension: Secondary | ICD-10-CM | POA: Diagnosis not present

## 2016-05-29 MED ORDER — CARVEDILOL 25 MG PO TABS: 25.0000 mg | ORAL_TABLET | Freq: Two times a day (BID) | ORAL | 3 refills | Status: DC

## 2016-05-29 MED ORDER — LISINOPRIL 40 MG PO TABS: 40.0000 mg | ORAL_TABLET | Freq: Every day | ORAL | 3 refills | Status: DC

## 2016-05-29 NOTE — Patient Instructions (Signed)

## 2016-05-29 NOTE — Progress Notes (Signed)
Sarah Williamson Date of Birth  02-07-1941       [facility] Office [address], [place], [place]  [address]   [place], [place]  [address] [phone]     [phone]   Fax  [phone]    Fax [phone]  Problem List: 1. Hypertension 2. 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 History breast cancer 4. Spinal stenosis 5. Pulmonary embolus- treated with Xarelto  History of Present Illness:  Sarah Williamson is doing well,  We  decreased her HCTZ and she  is doing well.  She still does not get much exercise.  Oct. 14, 2014:  Sarah Williamson is doing better.  She had right rotator cuff surgery and has been in rehab for most of the summer.   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    No dyspnea.    She takes her bp at home and typically get 130/ 82.    OCt. 1, 2015:  [name]  was hospitalized in May for a pulmonary embolus. She was started on Xarelto.  She saw [name] after the hospitalization. She did not have any significant ortho problems prior to her PE.   She had been taken off her ASA ( due to gout problems)   It appears that this was an unprovoked PE.   Oct. 3, 2016:  Sarah Williamson is doing well BP has been a bit high. She had an unprovoked pulmonary embolis .  Took xarelto for 6 months.   Was managed by oncology / hemotology .   May 14, 2015:  BP is a bit high today . Was elevated this am here in the office.  No CP or dyspnea.   May 29, 2016:  Doing well.  Not exercising as much as she should  No CP or dyspnea    Current Outpatient Prescriptions on File Prior to Visit  Medication Sig Dispense Refill  . acetaminophen (TYLENOL) 500 MG tablet Take 1,000 mg by mouth every 4 (four) hours as needed for mild pain or headache.     . carvedilol (COREG) 25 MG tablet Take 1 tablet (25 mg total) by mouth 2 (two) times daily. 180 tablet 1  . lisinopril (PRINIVIL,ZESTRIL) 40 MG tablet Take  1 tablet (40 mg total) by mouth daily. 90 tablet 1  . traMADol (ULTRAM) 50 MG tablet Take 50 mg by mouth every 6 (six) hours as needed (pain).      No current facility-administered medications on file prior to visit.     Allergies  Allergen Reactions  . Penicillins Other (See Comments)    Occurred as a child; unknown reaction    Past Medical History:  Diagnosis Date  . Arthritis   . Asymmetric septal hypertrophy (HCC)    WITH MILD LVOT OBSTRUCTION  . Cancer ([place])   . Chest pain, unspecified   . Heart murmur    sees nasher  . History of blood transfusion    due to chemo  . History of breast cancer   . Hot flashes   . Hypertension   . Personal history of PE (pulmonary embolism) 2015  . PONV (postoperative nausea and vomiting)   . Renal insufficiency    TRANSIENT- PROBABLY RESOLVED  . Shortness of breath dyspnea    exertion    Past Surgical History:  Procedure Laterality Date  . BTL     AND 2  MISCARRIAGES; rotator cuff last june  . EYE SURGERY Bilateral    cataracts  . MASTECTOMY Bilateral    7 year ago; lymph nodes removed right side  . ROTATOR CUFF REPAIR Right   . TOTAL KNEE ARTHROPLASTY Right 01/02/2014   Procedure: RIGHT TOTAL KNEE ARTHROPLASTY;  Surgeon: Vickey Huger, MD;  Location: [place];  Service: Orthopedics;  Laterality: Right;  . TUBAL LIGATION      History  Smoking Status  . Never Smoker  Smokeless Tobacco  . Never Used    History  Alcohol Use No    Family History  Problem Relation Age of Onset  . Hypertension Mother   . Heart failure Father   . Heart attack Father   . Diabetes Maternal Grandmother   . Diabetes Maternal Grandfather   . Heart attack Paternal Grandmother     Reviw of Systems:  Reviewed in the HPI.  All other systems are negative.  Physical Exam: Blood pressure (!) 126/58, pulse 77, height 4\' 8"  (1.422 m), weight 152 lb 12.8 oz (69.3 kg). General: Well developed, well nourished, in no acute distress.    Head:  Normocephalic, atraumatic, sclera non-icteric, mucus membranes are moist,   Neck: Supple. Carotids are 2 + without bruits. No JVD  Lungs: Clear bilaterally to auscultation.  Heart: regular rate.  normal  S1 S2. She is a very soft systolic murmur  Abdomen: Soft, non-tender, non-distended with normal bowel sounds. No hepatomegaly. No rebound/guarding. No masses.  Msk:  Strength and tone are normal  Extremities: No clubbing or cyanosis. trace edema in left foot .  Marland Kitchen   Distal pedal pulses are 2+ and equal bilaterally.  Neuro: Alert and oriented X 3. Moves all extremities spontaneously.  Psych:  Responds to questions appropriately with a normal affect.  ECG: May 29, 2016:   NSR at 55   Assessment / Plan:   1. Hypertension -BP has been good at home .    Continue same meds   2. 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 History breast cancer 4. Spinal stenosis 5. Pulmonary embolus- treated with Xarelto.    Is followed by Hem / Onc.   Observation         [name], MD  05/29/2016 10:47 AM    [address] [place],  [place], [place]  [address] Pager [phone] Phone: [phone]; Fax: [phone]   [facility]  [address] [place], [place]  [address] [phone]   Fax [phone]

## 2016-06-03 DIAGNOSIS — E782 Mixed hyperlipidemia: Secondary | ICD-10-CM | POA: Diagnosis not present

## 2016-06-03 DIAGNOSIS — M1990 Unspecified osteoarthritis, unspecified site: Secondary | ICD-10-CM | POA: Diagnosis not present

## 2016-06-03 DIAGNOSIS — I10 Essential (primary) hypertension: Secondary | ICD-10-CM | POA: Diagnosis not present

## 2016-06-03 DIAGNOSIS — Z86711 Personal history of pulmonary embolism: Secondary | ICD-10-CM | POA: Diagnosis not present

## 2016-06-03 DIAGNOSIS — Z0000 Encounter for general adult medical examination without abnormal findings: Secondary | ICD-10-CM | POA: Diagnosis not present

## 2016-06-03 DIAGNOSIS — K219 Gastro-esophageal reflux disease without esophagitis: Secondary | ICD-10-CM | POA: Diagnosis not present

## 2016-06-03 DIAGNOSIS — C50919 Malignant neoplasm of unspecified site of unspecified female breast: Secondary | ICD-10-CM | POA: Diagnosis not present

## 2016-06-03 DIAGNOSIS — J209 Acute bronchitis, unspecified: Secondary | ICD-10-CM | POA: Diagnosis not present

## 2016-06-03 DIAGNOSIS — N183 Chronic kidney disease, stage 3 (moderate): Secondary | ICD-10-CM | POA: Diagnosis not present

## 2016-07-22 DIAGNOSIS — Z86010 Personal history of colonic polyps: Secondary | ICD-10-CM | POA: Diagnosis not present

## 2016-07-22 DIAGNOSIS — K219 Gastro-esophageal reflux disease without esophagitis: Secondary | ICD-10-CM | POA: Diagnosis not present

## 2016-09-10 DIAGNOSIS — Z0100 Encounter for examination of eyes and vision without abnormal findings: Secondary | ICD-10-CM | POA: Diagnosis not present

## 2016-09-10 DIAGNOSIS — H524 Presbyopia: Secondary | ICD-10-CM | POA: Diagnosis not present

## 2016-11-11 DIAGNOSIS — Z23 Encounter for immunization: Secondary | ICD-10-CM | POA: Diagnosis not present

## 2016-11-12 DIAGNOSIS — Z23 Encounter for immunization: Secondary | ICD-10-CM | POA: Diagnosis not present

## 2016-12-05 DIAGNOSIS — N183 Chronic kidney disease, stage 3 (moderate): Secondary | ICD-10-CM | POA: Diagnosis not present

## 2016-12-05 DIAGNOSIS — M549 Dorsalgia, unspecified: Secondary | ICD-10-CM | POA: Diagnosis not present

## 2016-12-05 DIAGNOSIS — Z86711 Personal history of pulmonary embolism: Secondary | ICD-10-CM | POA: Diagnosis not present

## 2016-12-05 DIAGNOSIS — C50919 Malignant neoplasm of unspecified site of unspecified female breast: Secondary | ICD-10-CM | POA: Diagnosis not present

## 2016-12-05 DIAGNOSIS — R7309 Other abnormal glucose: Secondary | ICD-10-CM | POA: Diagnosis not present

## 2016-12-05 DIAGNOSIS — K219 Gastro-esophageal reflux disease without esophagitis: Secondary | ICD-10-CM | POA: Diagnosis not present

## 2016-12-05 DIAGNOSIS — M1990 Unspecified osteoarthritis, unspecified site: Secondary | ICD-10-CM | POA: Diagnosis not present

## 2016-12-05 DIAGNOSIS — E782 Mixed hyperlipidemia: Secondary | ICD-10-CM | POA: Diagnosis not present

## 2016-12-05 DIAGNOSIS — I10 Essential (primary) hypertension: Secondary | ICD-10-CM | POA: Diagnosis not present

## 2016-12-25 DIAGNOSIS — Z471 Aftercare following joint replacement surgery: Secondary | ICD-10-CM | POA: Diagnosis not present

## 2016-12-25 DIAGNOSIS — M25461 Effusion, right knee: Secondary | ICD-10-CM | POA: Diagnosis not present

## 2016-12-25 DIAGNOSIS — M61561 Other ossification of muscle, right lower leg: Secondary | ICD-10-CM | POA: Diagnosis not present

## 2016-12-25 DIAGNOSIS — Z96651 Presence of right artificial knee joint: Secondary | ICD-10-CM | POA: Diagnosis not present

## 2017-05-27 ENCOUNTER — Encounter: Payer: Self-pay | Admitting: Cardiovascular Disease

## 2017-06-08 ENCOUNTER — Ambulatory Visit: Payer: Medicare HMO | Admitting: Cardiovascular Disease

## 2017-06-08 ENCOUNTER — Encounter: Payer: Self-pay | Admitting: Cardiovascular Disease

## 2017-06-08 VITALS — BP 150/82 | HR 64 | Ht <= 58 in | Wt 155.0 lb

## 2017-06-08 DIAGNOSIS — I10 Essential (primary) hypertension: Secondary | ICD-10-CM | POA: Diagnosis not present

## 2017-06-08 MED ORDER — HYDROCHLOROTHIAZIDE 25 MG PO TABS: 25.0000 mg | ORAL_TABLET | Freq: Every day | ORAL | 3 refills | Status: DC

## 2017-06-08 NOTE — Patient Instructions (Signed)
Medication Instructions:  Your physician has recommended you make the following change in your medication:   START HCTZ (Hydrochlorothiazide) 25 mg once daily - take in the morning   Labwork: Your physician recommends that you return for lab work in: 3 weeks for basic metabolic panel   Testing/Procedures: None Ordered   Follow-Up: Your physician recommends that you schedule a follow-up appointment in: 3 months with a PA or Nurse Practitioner on Dr. Elmarie Shiley team   If you need a refill on your cardiac medications before your next appointment, please call your pharmacy.   Thank you for choosing [facility]! Christen Bame, RN [phone]

## 2017-06-08 NOTE — Progress Notes (Signed)
Sarah Williamson Date of Birth  08/30/1940       [facility]      [address]    [place], [place]  [address]    [phone]       Fax  [phone]       Problem List: 1. Hypertension 2. 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 History breast cancer 4. Spinal stenosis 5. Pulmonary embolus- treated with Xarelto     Sarah Williamson is doing well,  We  decreased her HCTZ and she  is doing well.  She still does not get much exercise.  Oct. 14, 2014:  Sarah Williamson is doing better.  She had right rotator cuff surgery and has been in rehab for most of the summer.   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    No dyspnea.    She takes her bp at home and typically get 130/ 82.    OCt. 1, 2015:  Sarah Williamson  was hospitalized in May for a pulmonary embolus. She was started on Xarelto.  She saw [name] after the hospitalization. She did not have any significant ortho problems prior to her PE.   She had been taken off her ASA ( due to gout problems)   It appears that this was an unprovoked PE.   Oct. 3, 2016:  Sarah Williamson is doing well BP has been a bit high. She had an unprovoked pulmonary embolis .  Took xarelto for 6 months.   Was managed by oncology / hemotology .   May 14, 2015:  BP is a bit high today . Was elevated this am here in the office.  No CP or dyspnea.   May 29, 2016:  Doing well.  Not exercising as much as she should  No CP or dyspnea   June 08, 2017 :  Sarah Williamson is seen today for follow up of her HTN Has been recording her BP -  Most readings are a little elevated.  Still eating more salt than she should  Sausage biscuits in the am   Current Outpatient Medications on File Prior to Visit  Medication Sig Dispense Refill  . acetaminophen (TYLENOL) 500 MG tablet Take 1,000 mg by mouth every 4 (four) hours as needed for mild pain or headache.     . carvedilol (COREG) 25 MG tablet Take 1 tablet (25 mg total) by mouth 2 (two)  times daily. 180 tablet 3  . lisinopril (PRINIVIL,ZESTRIL) 40 MG tablet Take 1 tablet (40 mg total) by mouth daily. 90 tablet 3  . traMADol (ULTRAM) 50 MG tablet Take 50 mg by mouth every 6 (six) hours as needed (pain).      No current facility-administered medications on file prior to visit.     Allergies  Allergen Reactions  . Penicillins Other (See Comments)    Occurred as a child; unknown reaction    Past Medical History:  Diagnosis Date  . Arthritis   . Asymmetric septal hypertrophy (HCC)    WITH MILD LVOT OBSTRUCTION  . Cancer ([place])   . Chest pain, unspecified   . Heart murmur    sees nasher  . History of blood transfusion    due to chemo  . History of breast cancer   . Hot flashes   . Hypertension   . Personal history of PE (pulmonary embolism) 2015  . PONV (postoperative nausea and vomiting)   . Renal insufficiency  TRANSIENT- PROBABLY RESOLVED  . Shortness of breath dyspnea    exertion    Past Surgical History:  Procedure Laterality Date  . BTL     AND 2 MISCARRIAGES; rotator cuff last june  . EYE SURGERY Bilateral    cataracts  . MASTECTOMY Bilateral    7 year ago; lymph nodes removed right side  . ROTATOR CUFF REPAIR Right   . TOTAL KNEE ARTHROPLASTY Right 01/02/2014   Procedure: RIGHT TOTAL KNEE ARTHROPLASTY;  Surgeon: Vickey Huger, MD;  Location: [facility] [place];  Service: Orthopedics;  Laterality: Right;  . TUBAL LIGATION      Social History   Tobacco Use  Smoking Status Never Smoker  Smokeless Tobacco Never Used    Social History   Substance and Sexual Activity  Alcohol Use No    Family History  Problem Relation Age of Onset  . Hypertension Mother   . Heart failure Father   . Heart attack Father   . Diabetes Maternal Grandmother   . Diabetes Maternal Grandfather   . Heart attack Paternal Grandmother     Reviw of Systems:  Noted in current history, otherwise negative.  Physical Exam: Blood pressure (!) 150/82, pulse 64, height 4\' 8"   (1.422 m), weight 155 lb (70.3 kg), SpO2 97 %.  GEN:  Well nourished, well developed in no acute distress HEENT: Normal NECK: No JVD; No carotid bruits LYMPHATICS: No lymphadenopathy CARDIAC: RRR , no significant murmur  RESPIRATORY:  Clear to auscultation without rales, wheezing or rhonchi  ABDOMEN: Soft, non-tender, non-distended MUSCULOSKELETAL:  No edema; No deformity  SKIN: Warm and dry NEUROLOGIC:  Alert and oriented x 3   ECG: June 08, 2017: Normal sinus rhythm at 64.  First-degree AV block.  Low voltage EKG.    Assessment / Plan:   1. Hypertension -    BP is mildly elevated.   Will add HCTZ 25 mg a day  BMP in 3 weeks . Follow up with APP in 3 months   2. 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 History breast cancer 4. Spinal stenosis 5. Pulmonary embolus-       [name], MD  06/08/2017 12:13 PM    [address] [place],  [place], [place]  [address] Pager [phone] Phone: [phone]; Fax: [phone]

## 2017-06-22 DIAGNOSIS — Z0000 Encounter for general adult medical examination without abnormal findings: Secondary | ICD-10-CM | POA: Diagnosis not present

## 2017-06-22 DIAGNOSIS — M549 Dorsalgia, unspecified: Secondary | ICD-10-CM | POA: Diagnosis not present

## 2017-06-22 DIAGNOSIS — K219 Gastro-esophageal reflux disease without esophagitis: Secondary | ICD-10-CM | POA: Diagnosis not present

## 2017-06-22 DIAGNOSIS — E782 Mixed hyperlipidemia: Secondary | ICD-10-CM | POA: Diagnosis not present

## 2017-06-22 DIAGNOSIS — I10 Essential (primary) hypertension: Secondary | ICD-10-CM | POA: Diagnosis not present

## 2017-06-22 DIAGNOSIS — R7303 Prediabetes: Secondary | ICD-10-CM | POA: Diagnosis not present

## 2017-06-22 DIAGNOSIS — M1990 Unspecified osteoarthritis, unspecified site: Secondary | ICD-10-CM | POA: Diagnosis not present

## 2017-06-22 DIAGNOSIS — M109 Gout, unspecified: Secondary | ICD-10-CM | POA: Diagnosis not present

## 2017-06-22 DIAGNOSIS — N183 Chronic kidney disease, stage 3 (moderate): Secondary | ICD-10-CM | POA: Diagnosis not present

## 2017-06-23 ENCOUNTER — Telehealth: Payer: Self-pay | Admitting: Cardiovascular Disease

## 2017-06-23 NOTE — Telephone Encounter (Signed)
Spoke with patient who states she was seen by her PCP, Dr. Moreen Fowler and was advised to stop HCTZ due to gout. She states he started her on prednisone and she is feeling better but he advised her not to take the HCTZ. I advised her to monitor BP after she completes the prednisone Rx and to call back prior to appointment on August 1 if BP is elevated or with other concerns. I also gave her advice on the low purine diet to help prevent gout. Patient verbalized understanding and agreement with plan and thanked me for the call.

## 2017-06-23 NOTE — Telephone Encounter (Signed)
New Message    Pt c/o medication issue:  1. Name of Medication: hydrochlorothiazide   2. How are you currently taking this medication (dosage and times per day)?   3. Are you having a reaction (difficulty breathing--STAT)?   4. What is your medication issue? Patient states that she went to her PCP and they advise that her uric acid is high. She has also developed gout as well and can hardly walk.

## 2017-07-01 ENCOUNTER — Other Ambulatory Visit: Payer: Medicare HMO

## 2017-07-16 ENCOUNTER — Other Ambulatory Visit: Payer: Self-pay | Admitting: Cardiovascular Disease

## 2017-07-16 DIAGNOSIS — M109 Gout, unspecified: Secondary | ICD-10-CM | POA: Diagnosis not present

## 2017-07-21 IMAGING — US US EXTREM LOW VENOUS*R*
1 series · 13 of 24 positions shown · non-contrast
Comparison: None.

CLINICAL DATA: Acute onset of right ankle pain and swelling.
Initial encounter.



[Series 1: us extrem low venous*right* · 0.08mm/px · 13 of 29 slices shown]
[im 1/29]
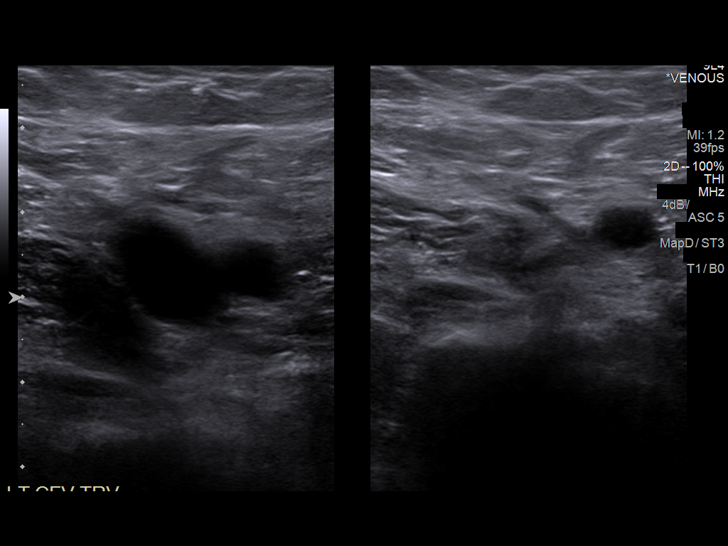
[im 3/29]
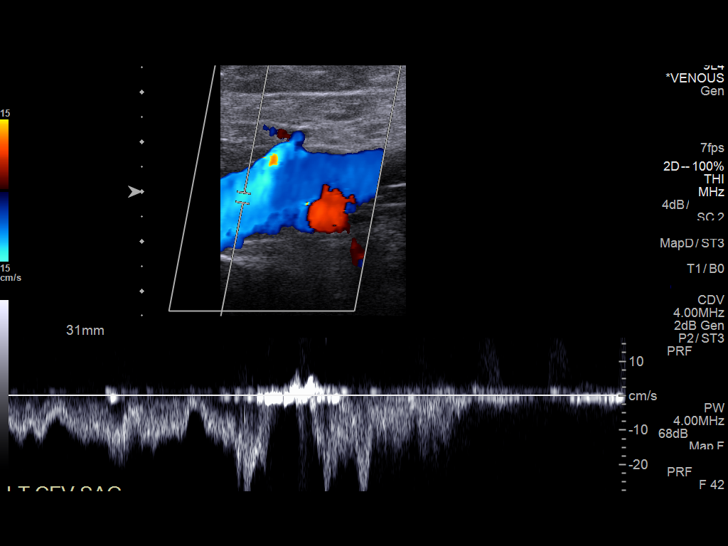
[im 5/29]
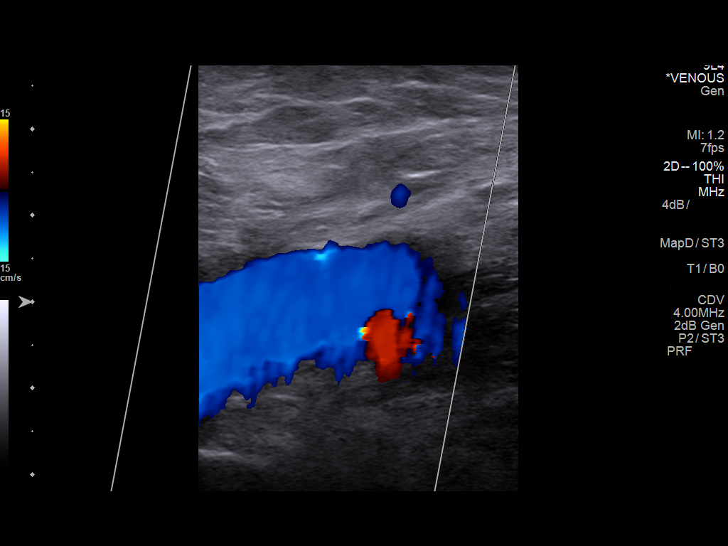
[im 8/29]
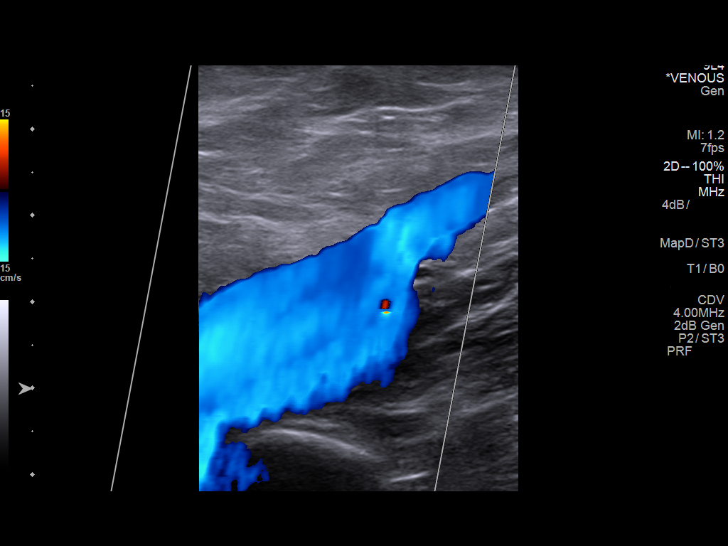
[im 10/29]
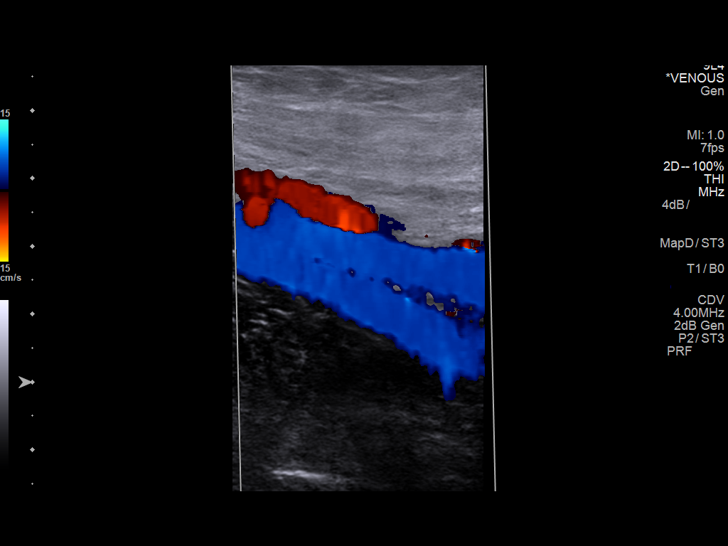
[im 13/29]
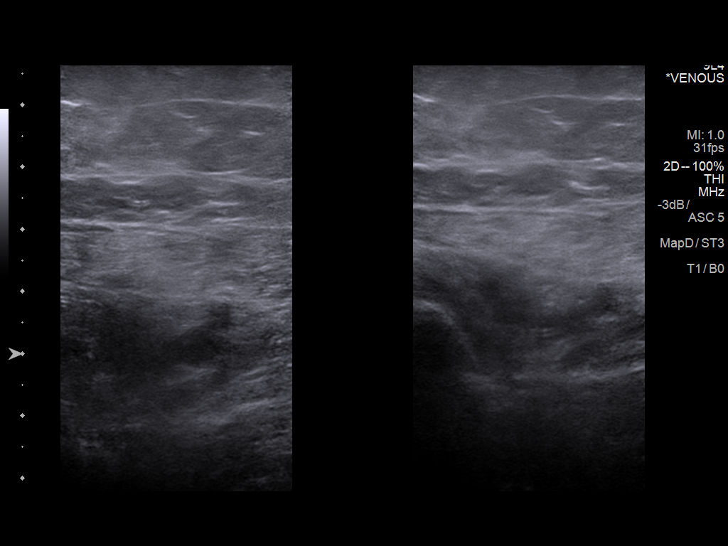
[im 15/29]
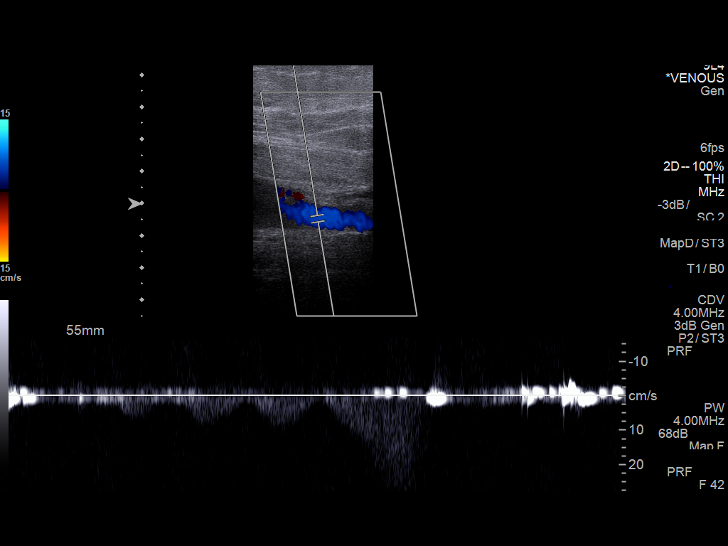
[im 16/29]
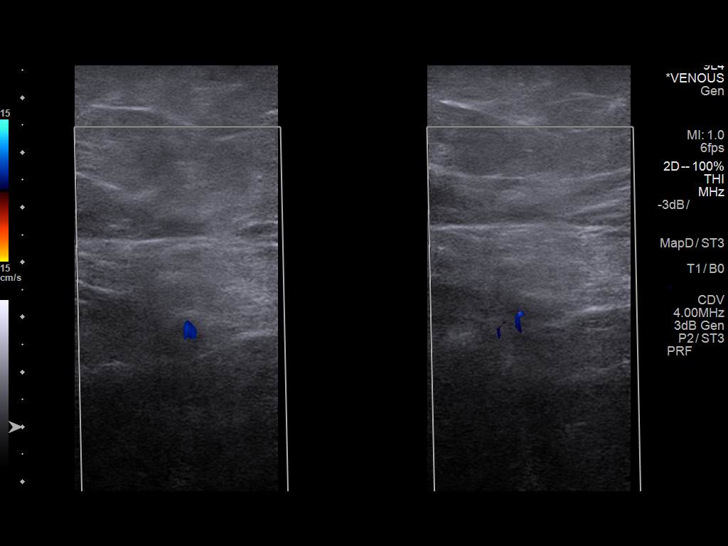
[im 19/29]
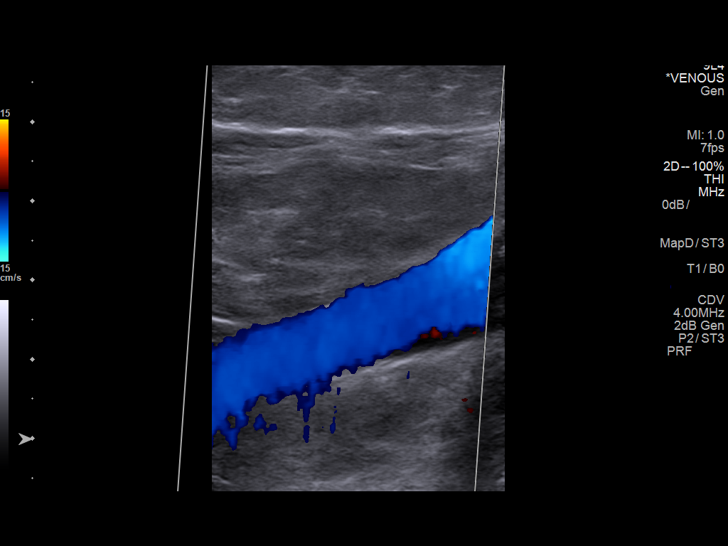
[im 21/29]
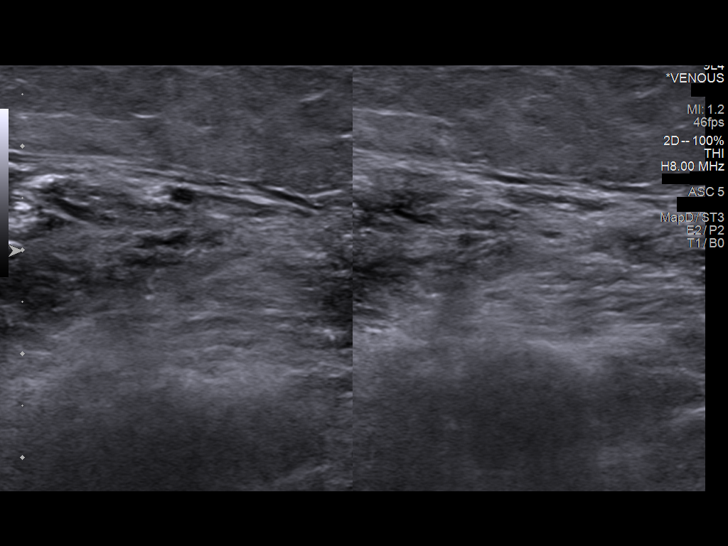
[im 24/29]
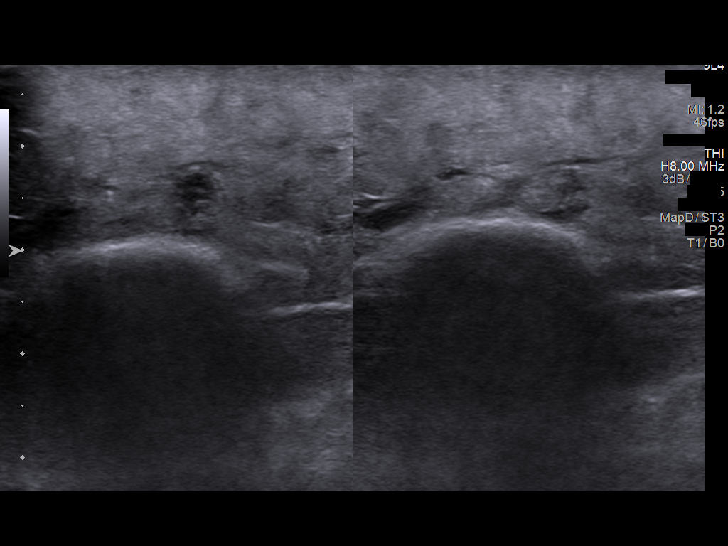
[im 26/29]
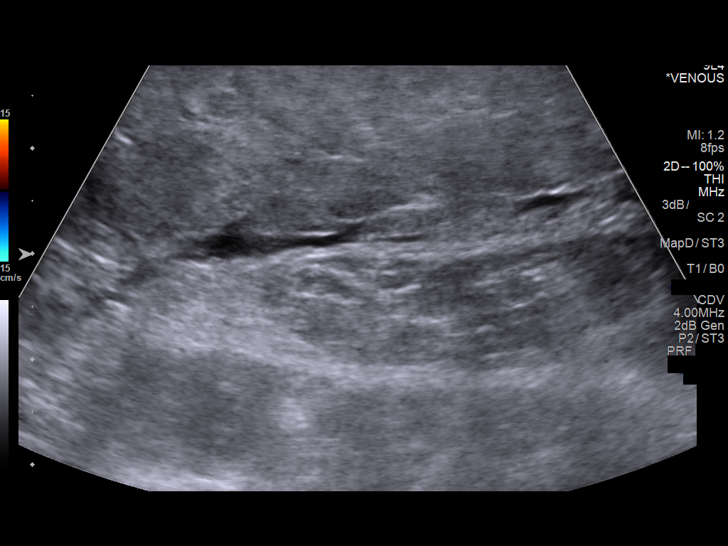
[im 29/29]
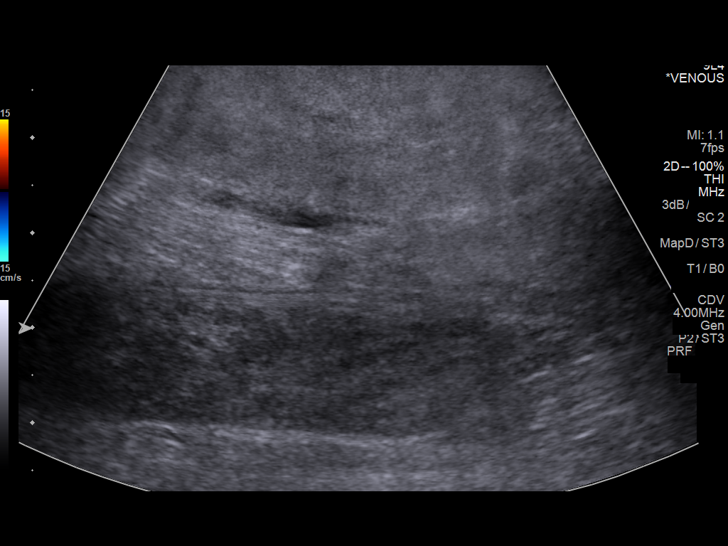

[13 of 24 positions shown; findings below may reference images not displayed]

FINDINGS: Contralateral Common Femoral Vein: Respiratory phasicity is normal
and symmetric with the symptomatic side. No evidence of thrombus.
Normal compressibility.

Common Femoral Vein: No evidence of thrombus. Normal
compressibility, respiratory phasicity and response to augmentation.

Saphenofemoral Junction: No evidence of thrombus. Normal
compressibility and flow on color Doppler imaging.

Profunda Femoral Vein: No evidence of thrombus. Normal
compressibility and flow on color Doppler imaging.

Femoral Vein: No evidence of thrombus. Normal compressibility,
respiratory phasicity and response to augmentation.

Popliteal Vein: No evidence of thrombus. Normal compressibility,
respiratory phasicity and response to augmentation.

Calf Veins: No evidence of thrombus. Normal compressibility and flow
on color Doppler imaging. The peroneal vein is not visualized.

Superficial Great Saphenous Vein: No evidence of thrombus. Normal
compressibility and flow on color Doppler imaging.

Venous Reflux:  None.

Other Findings: A small Baker's cyst is noted at the popliteal
fossa, measuring 1.7 x 0.5 x 1.3 cm. There is minimal soft tissue
edema noted along the right calf.
IMPRESSION: 1. No evidence of deep venous thrombosis.
2. Small Baker's cyst at the popliteal fossa, measuring 1.7 x 0.5 x
1.3 cm.
3. Minimal soft tissue edema along the right calf.

## 2017-07-29 ENCOUNTER — Other Ambulatory Visit: Payer: Medicare HMO

## 2017-08-24 ENCOUNTER — Other Ambulatory Visit: Payer: Self-pay | Admitting: Cardiovascular Disease

## 2017-08-24 DIAGNOSIS — I10 Essential (primary) hypertension: Secondary | ICD-10-CM

## 2017-08-24 DIAGNOSIS — R011 Cardiac murmur, unspecified: Secondary | ICD-10-CM

## 2017-08-25 DIAGNOSIS — E2839 Other primary ovarian failure: Secondary | ICD-10-CM | POA: Diagnosis not present

## 2017-08-25 DIAGNOSIS — M8588 Other specified disorders of bone density and structure, other site: Secondary | ICD-10-CM | POA: Diagnosis not present

## 2017-09-10 ENCOUNTER — Encounter (INDEPENDENT_AMBULATORY_CARE_PROVIDER_SITE_OTHER): Payer: Self-pay

## 2017-09-10 ENCOUNTER — Encounter: Payer: Self-pay | Admitting: Cardiovascular Disease

## 2017-09-10 ENCOUNTER — Ambulatory Visit: Payer: Medicare HMO | Admitting: Cardiovascular Disease

## 2017-09-10 VITALS — BP 130/84 | HR 85 | Ht <= 58 in | Wt 156.4 lb

## 2017-09-10 DIAGNOSIS — I10 Essential (primary) hypertension: Secondary | ICD-10-CM

## 2017-09-10 NOTE — Patient Instructions (Signed)
Medication Instructions:  Your physician recommends that you continue on your current medications as directed. Please refer to the Current Medication list given to you today.   Labwork: None Ordered   Testing/Procedures: None Ordered   Follow-Up: Your physician wants you to follow-up in: 1 year with Dr. Nahser.  You will receive a reminder letter in the mail two months in advance. If you don't receive a letter, please call our office to schedule the follow-up appointment.   If you need a refill on your cardiac medications before your next appointment, please call your pharmacy.   Thank you for choosing [facility]! Maliha Outten, RN [phone]    

## 2017-09-10 NOTE — Progress Notes (Signed)
Sarah Williamson Date of Birth  Jun 18, 1940       [facility]      [address] [place]    [place], [place]  [address]    [phone]       Fax  [phone]       Problem List: 1. Hypertension 2. 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 History breast cancer 4. Spinal stenosis 5. Pulmonary embolus- treated with Xarelto     [name] is doing well,  We  decreased her HCTZ and she  is doing well.  She still does not get much exercise.  Oct. 14, 2014:  Sarah Williamson is doing better.  She had right rotator cuff surgery and has been in rehab for most of the summer.   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    No dyspnea.    She takes her bp at home and typically get 130/ 82.    OCt. 1, 2015:  Sarah Williamson  was hospitalized in May for a pulmonary embolus. She was started on Xarelto.  She saw [name] after the hospitalization. She did not have any significant ortho problems prior to her PE.   She had been taken off her ASA ( due to gout problems)   It appears that this was an unprovoked PE.   Oct. 3, 2016:  Sarah Williamson is doing well BP has been a bit high. She had an unprovoked pulmonary embolis .  Took xarelto for 6 months.   Was managed by oncology / hemotology .   May 14, 2015:  BP is a bit high today . Was elevated this am here in the office.  No CP or dyspnea.   May 29, 2016:  Doing well.  Not exercising as much as she should  No CP or dyspnea   June 08, 2017 :  [name] is seen today for follow up of her HTN Has been recording her BP -  Most readings are a little elevated.  Still eating more salt than she should  Sausage biscuits in the am   September 10, 2017:  Sarah Williamson is seen today for follow up of her HTN We tried HCTZ but she developed gout BP readings at home look very good.  No cp , no dyspnea.  Is avoiding salt   Current Outpatient Medications on File Prior to Visit  Medication Sig Dispense Refill  . acetaminophen  (TYLENOL) 500 MG tablet Take 1,000 mg by mouth every 4 (four) hours as needed for mild pain or headache.     Marland Kitchen aspirin EC 81 MG tablet Take 81 mg by mouth daily.    Marland Kitchen CALCIUM CITRATE-VITAMIN D PO Take 1 tablet by mouth daily.    . carvedilol (COREG) 25 MG tablet TAKE 1 TABLET TWICE DAILY 180 tablet 3  . lisinopril (PRINIVIL,ZESTRIL) 40 MG tablet Take 1 tablet (40 mg total) by mouth daily. 90 tablet 2  . traMADol (ULTRAM) 50 MG tablet Take 50 mg by mouth every 6 (six) hours as needed (pain).      No current facility-administered medications on file prior to visit.     Allergies  Allergen Reactions  . Penicillins Other (See Comments)    Occurred as a child; unknown reaction    Past Medical History:  Diagnosis Date  . Arthritis   . Asymmetric septal hypertrophy (HCC)    WITH MILD LVOT OBSTRUCTION  . Cancer ([place])   . Chest pain,  unspecified   . Heart murmur    sees nasher  . History of blood transfusion    due to chemo  . History of breast cancer   . Hot flashes   . Hypertension   . Personal history of PE (pulmonary embolism) 2015  . PONV (postoperative nausea and vomiting)   . Renal insufficiency    TRANSIENT- PROBABLY RESOLVED  . Shortness of breath dyspnea    exertion    Past Surgical History:  Procedure Laterality Date  . BTL     AND 2 MISCARRIAGES; rotator cuff last june  . EYE SURGERY Bilateral    cataracts  . MASTECTOMY Bilateral    7 year ago; lymph nodes removed right side  . ROTATOR CUFF REPAIR Right   . 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 TUBAL LIGATION      Social History   Tobacco Use  Smoking Status Never Smoker  Smokeless Tobacco Never Used    Social History   Substance and Sexual Activity  Alcohol Use No    Family History  Problem Relation Age of Onset  . Hypertension Mother   . Heart failure Father    . Heart attack Father   . Diabetes Maternal Grandmother   . Diabetes Maternal Grandfather   . Heart attack Paternal Grandmother     Reviw of Systems:  Noted in current history, otherwise negative.  Physical Exam: Blood pressure 130/84, pulse 85, height 4\' 8"  (1.422 m), weight 156 lb 6.4 oz (70.9 kg), SpO2 97 %.  GEN:  Well nourished, well developed in no acute distress HEENT: Normal NECK: No JVD; No carotid bruits LYMPHATICS: No lymphadenopathy CARDIAC: RRR , no significant murmur  RESPIRATORY:  Clear to auscultation without rales, wheezing or rhonchi  ABDOMEN: Soft, non-tender, non-distended MUSCULOSKELETAL:  No edema; No deformity  SKIN: Warm and dry NEUROLOGIC:  Alert and oriented x 3   ECG: June 08, 2017: Normal sinus rhythm at 64.  First-degree AV block.  Low voltage EKG.    Assessment / Plan:   1. Hypertension -      BP has been better.  She still eats some salty foods.- processed meats.  Of advised her to cut out her hot dogs and other processed meats.  I advised her to exercise and work on weight loss program.  2. 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 History breast cancer 4. Spinal stenosis 5. Pulmonary embolus-   - no longer on anticoagulation      [name], MD  09/10/2017 9:53 AM    [place],  [place], [place]  [address] Pager [phone] Phone: [phone]; Fax: [phone]

## 2017-09-27 DIAGNOSIS — M109 Gout, unspecified: Secondary | ICD-10-CM | POA: Diagnosis not present

## 2017-11-12 DIAGNOSIS — Z23 Encounter for immunization: Secondary | ICD-10-CM | POA: Diagnosis not present

## 2017-12-07 DIAGNOSIS — M109 Gout, unspecified: Secondary | ICD-10-CM | POA: Diagnosis not present

## 2017-12-21 DIAGNOSIS — H5200 Hypermetropia, unspecified eye: Secondary | ICD-10-CM | POA: Diagnosis not present

## 2018-01-15 DIAGNOSIS — R600 Localized edema: Secondary | ICD-10-CM | POA: Diagnosis not present

## 2018-01-15 DIAGNOSIS — L719 Rosacea, unspecified: Secondary | ICD-10-CM | POA: Diagnosis not present

## 2018-01-15 DIAGNOSIS — R609 Edema, unspecified: Secondary | ICD-10-CM | POA: Diagnosis not present

## 2018-01-15 DIAGNOSIS — M7989 Other specified soft tissue disorders: Secondary | ICD-10-CM | POA: Diagnosis not present

## 2018-01-15 DIAGNOSIS — R252 Cramp and spasm: Secondary | ICD-10-CM | POA: Diagnosis not present

## 2018-01-15 DIAGNOSIS — K219 Gastro-esophageal reflux disease without esophagitis: Secondary | ICD-10-CM | POA: Diagnosis not present

## 2018-01-15 DIAGNOSIS — M10079 Idiopathic gout, unspecified ankle and foot: Secondary | ICD-10-CM | POA: Diagnosis not present

## 2018-01-15 DIAGNOSIS — I10 Essential (primary) hypertension: Secondary | ICD-10-CM | POA: Diagnosis not present

## 2018-02-18 DIAGNOSIS — R600 Localized edema: Secondary | ICD-10-CM | POA: Diagnosis not present

## 2018-02-18 DIAGNOSIS — I8312 Varicose veins of left lower extremity with inflammation: Secondary | ICD-10-CM | POA: Diagnosis not present

## 2018-02-18 DIAGNOSIS — I83893 Varicose veins of bilateral lower extremities with other complications: Secondary | ICD-10-CM | POA: Diagnosis not present

## 2018-02-18 DIAGNOSIS — I8311 Varicose veins of right lower extremity with inflammation: Secondary | ICD-10-CM | POA: Diagnosis not present

## 2018-02-20 DIAGNOSIS — R21 Rash and other nonspecific skin eruption: Secondary | ICD-10-CM | POA: Diagnosis not present

## 2018-02-22 DIAGNOSIS — L270 Generalized skin eruption due to drugs and medicaments taken internally: Secondary | ICD-10-CM | POA: Diagnosis not present

## 2018-02-22 DIAGNOSIS — I10 Essential (primary) hypertension: Secondary | ICD-10-CM | POA: Diagnosis not present

## 2018-02-22 DIAGNOSIS — M109 Gout, unspecified: Secondary | ICD-10-CM | POA: Diagnosis not present

## 2018-03-07 ENCOUNTER — Emergency Department (HOSPITAL_BASED_OUTPATIENT_CLINIC_OR_DEPARTMENT_OTHER)
Admission: EM | Admit: 2018-03-07 | Discharge: 2018-03-07 | Disposition: A | Discharge status: Home / Self Care | Payer: Medicare HMO | Attending: Emergency Medicine | Admitting: Emergency Medicine

## 2018-03-07 ENCOUNTER — Other Ambulatory Visit: Payer: Self-pay

## 2018-03-07 ENCOUNTER — Emergency Department (HOSPITAL_BASED_OUTPATIENT_CLINIC_OR_DEPARTMENT_OTHER): Payer: Medicare HMO

## 2018-03-07 ENCOUNTER — Encounter (HOSPITAL_BASED_OUTPATIENT_CLINIC_OR_DEPARTMENT_OTHER): Payer: Self-pay | Admitting: Emergency Medicine

## 2018-03-07 DIAGNOSIS — Z9013 Acquired absence of bilateral breasts and nipples: Secondary | ICD-10-CM | POA: Diagnosis not present

## 2018-03-07 DIAGNOSIS — Z853 Personal history of malignant neoplasm of breast: Secondary | ICD-10-CM | POA: Diagnosis not present

## 2018-03-07 DIAGNOSIS — M25572 Pain in left ankle and joints of left foot: Secondary | ICD-10-CM | POA: Insufficient documentation

## 2018-03-07 DIAGNOSIS — M2550 Pain in unspecified joint: Secondary | ICD-10-CM

## 2018-03-07 DIAGNOSIS — I129 Hypertensive chronic kidney disease with stage 1 through stage 4 chronic kidney disease, or unspecified chronic kidney disease: Secondary | ICD-10-CM | POA: Insufficient documentation

## 2018-03-07 DIAGNOSIS — Z96651 Presence of right artificial knee joint: Secondary | ICD-10-CM | POA: Insufficient documentation

## 2018-03-07 DIAGNOSIS — Z7982 Long term (current) use of aspirin: Secondary | ICD-10-CM | POA: Diagnosis not present

## 2018-03-07 DIAGNOSIS — Z8679 Personal history of other diseases of the circulatory system: Secondary | ICD-10-CM | POA: Insufficient documentation

## 2018-03-07 DIAGNOSIS — R0602 Shortness of breath: Secondary | ICD-10-CM | POA: Diagnosis not present

## 2018-03-07 DIAGNOSIS — N189 Chronic kidney disease, unspecified: Secondary | ICD-10-CM | POA: Insufficient documentation

## 2018-03-07 DIAGNOSIS — R05 Cough: Secondary | ICD-10-CM | POA: Insufficient documentation

## 2018-03-07 DIAGNOSIS — K449 Diaphragmatic hernia without obstruction or gangrene: Secondary | ICD-10-CM | POA: Diagnosis not present

## 2018-03-07 DIAGNOSIS — M25571 Pain in right ankle and joints of right foot: Secondary | ICD-10-CM | POA: Diagnosis not present

## 2018-03-07 DIAGNOSIS — Z86711 Personal history of pulmonary embolism: Secondary | ICD-10-CM | POA: Insufficient documentation

## 2018-03-07 DIAGNOSIS — R059 Cough, unspecified: Secondary | ICD-10-CM

## 2018-03-07 DIAGNOSIS — R918 Other nonspecific abnormal finding of lung field: Secondary | ICD-10-CM | POA: Diagnosis not present

## 2018-03-07 DIAGNOSIS — R600 Localized edema: Secondary | ICD-10-CM | POA: Insufficient documentation

## 2018-03-07 DIAGNOSIS — Z79899 Other long term (current) drug therapy: Secondary | ICD-10-CM | POA: Insufficient documentation

## 2018-03-07 DIAGNOSIS — M7989 Other specified soft tissue disorders: Secondary | ICD-10-CM | POA: Diagnosis not present

## 2018-03-07 LAB — URINALYSIS, ROUTINE W REFLEX MICROSCOPIC
Bilirubin Urine: NEGATIVE
Glucose, UA: NEGATIVE mg/dL
HGB URINE DIPSTICK: NEGATIVE
Ketones, ur: NEGATIVE mg/dL
Leukocytes, UA: NEGATIVE
Nitrite: NEGATIVE
Protein, ur: NEGATIVE mg/dL
Specific Gravity, Urine: 1.01 (ref 1.005–1.030)
pH: 5.5 (ref 5.0–8.0)

## 2018-03-07 LAB — CBC WITH DIFFERENTIAL/PLATELET
Abs Immature Granulocytes: 0.12 10*3/uL — ABNORMAL HIGH (ref 0.00–0.07)
BASOS PCT: 0 %
Basophils Absolute: 0 10*3/uL (ref 0.0–0.1)
EOS ABS: 0.2 10*3/uL (ref 0.0–0.5)
Eosinophils Relative: 1 %
HCT: 40.5 % (ref 36.0–46.0)
Hemoglobin: 12.7 g/dL (ref 12.0–15.0)
Immature Granulocytes: 1 %
Lymphocytes Relative: 6 %
Lymphs Abs: 1.1 10*3/uL (ref 0.7–4.0)
MCH: 30.9 pg (ref 26.0–34.0)
MCHC: 31.4 g/dL (ref 30.0–36.0)
MCV: 98.5 fL (ref 80.0–100.0)
MONO ABS: 1.7 10*3/uL — AB (ref 0.1–1.0)
Monocytes Relative: 10 %
NEUTROS PCT: 82 %
Neutro Abs: 14 10*3/uL — ABNORMAL HIGH (ref 1.7–7.7)
PLATELETS: 229 10*3/uL (ref 150–400)
RBC: 4.11 MIL/uL (ref 3.87–5.11)
RDW: 13.7 % (ref 11.5–15.5)
WBC: 17.1 10*3/uL — ABNORMAL HIGH (ref 4.0–10.5)
nRBC: 0 % (ref 0.0–0.2)

## 2018-03-07 LAB — TROPONIN I
Troponin I: <0.03 ng/mL (ref <0.03)
Troponin I: <0.03 ng/mL (ref <0.03)

## 2018-03-07 LAB — SEDIMENTATION RATE: Sed Rate: 34 mm/hr — ABNORMAL HIGH (ref 0–22)

## 2018-03-07 LAB — BRAIN NATRIURETIC PEPTIDE: B NATRIURETIC PEPTIDE 5: 75.7 pg/mL (ref 0.0–100.0)

## 2018-03-07 LAB — COMPREHENSIVE METABOLIC PANEL
ALT: 15 U/L (ref 0–44)
AST: 20 U/L (ref 15–41)
Albumin: 3.6 g/dL (ref 3.5–5.0)
Alkaline Phosphatase: 89 U/L (ref 38–126)
Anion gap: 7 (ref 5–15)
BILIRUBIN TOTAL: 0.8 mg/dL (ref 0.3–1.2)
BUN: 36 mg/dL — ABNORMAL HIGH (ref 8–23)
CO2: 28 mmol/L (ref 22–32)
Calcium: 9.2 mg/dL (ref 8.9–10.3)
Chloride: 100 mmol/L (ref 98–111)
Creatinine, Ser: 1.54 mg/dL — ABNORMAL HIGH (ref 0.44–1.00)
GFR, EST AFRICAN AMERICAN: 38 mL/min — AB (ref >60)
GFR, EST NON AFRICAN AMERICAN: 32 mL/min — AB (ref >60)
Glucose, Bld: 164 mg/dL — ABNORMAL HIGH (ref 70–99)
Potassium: 4 mmol/L (ref 3.5–5.1)
Sodium: 135 mmol/L (ref 135–145)
TOTAL PROTEIN: 6.4 g/dL — AB (ref 6.5–8.1)

## 2018-03-07 LAB — C-REACTIVE PROTEIN: CRP: 13.4 mg/dL — ABNORMAL HIGH (ref <1.0)

## 2018-03-07 MED ORDER — IOPAMIDOL (ISOVUE-370) INJECTION 76%
100.0000 mL | Freq: Once | INTRAVENOUS | Status: AC | PRN
  Administered 2018-03-07: 80 mL via INTRAVENOUS

## 2018-03-07 NOTE — ED Notes (Signed)
ED Provider at bedside. 

## 2018-03-07 NOTE — ED Notes (Addendum)
ED Provider at bedside. Pt changed into gown for exam

## 2018-03-07 NOTE — Discharge Instructions (Addendum)
You have been seen today for cough, shortness of breath, and joint stiffness. Please read and follow all provided instructions.   1. Medications: usual home medications 2. Treatment: rest, drink plenty of fluids 3. Follow Up: Please follow up with your primary doctor in 2 days for discussion of your diagnoses and further evaluation after today's visit; if you do not have a primary care doctor use the resource guide provided to find one; Please return to the ER for any new or worsening symptoms. Please obtain all of your results from medical records or have your doctors office obtain the results - share them with your doctor - you should be seen at your doctors office. Call today to arrange your follow up.   Take medications as prescribed. Please review all of the medicines and only take them if you do not have an allergy to them. Return to the emergency room for worsening condition or new concerning symptoms. Follow up with your regular doctor. If you don't have a regular doctor use one of the numbers below to establish a primary care doctor.  Please be aware that if you are taking birth control pills, taking other prescriptions, ESPECIALLY ANTIBIOTICS may make the birth control ineffective - if this is the case, either do not engage in sexual activity or use alternative methods of birth control such as condoms until you have finished the medicine and your family doctor says it is OK to restart them. If you are on a blood thinner such as COUMADIN, be aware that any other medicine that you take may cause the coumadin to either work too much, or not enough - you should have your coumadin level rechecked in next 7 days if this is the case.  ?  It is also a possibility that you have an allergic reaction to any of the medicines that you have been prescribed - Everybody reacts differently to medications and while MOST people have no trouble with most medicines, you may have a reaction such as nausea, vomiting,  rash, swelling, shortness of breath. If this is the case, please stop taking the medicine immediately and contact your physician.  ?  You should return to the ER if you develop severe or worsening symptoms.   Emergency Department Resource Guide 1) Find a Doctor and Pay Out of Pocket Although you won't have to find out who is covered by your insurance plan, it is a good idea to ask around and get recommendations. You will then need to call the office and see if the doctor you have chosen will accept you as a new patient and what types of options they offer for patients who are self-pay. Some doctors offer discounts or will set up payment plans for their patients who do not have insurance, but you will need to ask so you aren't surprised when you get to your appointment.  2) Contact Your Local Health Department Not all health departments have doctors that can see patients for sick visits, but many do, so it is worth a call to see if yours does. If you don't know where your local health department is, you can check in your phone book. The CDC also has a tool to help you locate your state's health department, and many state websites also have listings of all of their local health departments.  3) Find a [facility] If your illness is not likely to be very severe or complicated, you may want to try a walk in clinic. These  are popping up all over the country in pharmacies, drugstores, and shopping centers. They're usually staffed by nurse practitioners or physician assistants that have been trained to treat common illnesses and complaints. They're usually fairly quick and inexpensive. However, if you have serious medical issues or chronic medical problems, these are probably not your best option.  No Primary Care Doctor: Call Health Connect at  [phone] - they can help you locate a primary care doctor that  accepts your insurance, provides certain services, etc. Physician Referral [phone]  Emergency Department Resource Guide 1) Find a Doctor and Pay Out of Pocket Although you won't have to find out who is covered by your insurance plan, it is a good idea to ask around and get recommendations. You will then need to call the office and see if the doctor you have chosen will accept you as a new patient and what types of options they offer for patients who are self-pay. Some doctors offer discounts or will set up payment plans for their patients who do not have insurance, but you will need to ask so you aren't surprised when you get to your appointment.  2) Contact Your Local Health Department Not all health departments have doctors that can see patients for sick visits, but many do, so it is worth a call to see if yours does. If you don't know where your local health department is, you can check in your phone book. The CDC also has a tool to help you locate your state's health department, and many state websites also have listings of all of their local health departments.  3) Find a [facility] If your illness is not likely to be very severe or complicated, you may want to try a walk in clinic. These are popping up all over the country in pharmacies, drugstores, and shopping centers. They're usually staffed by nurse practitioners or physician assistants that have been trained to treat common illnesses and complaints. They're usually fairly quick and inexpensive. However, if you have serious medical issues or chronic medical problems, these are probably not your best option.  No Primary Care Doctor: Call Health Connect at  [phone] - they can help you locate a primary care doctor that  accepts your insurance, provides certain services, etc. Physician Referral Service- [phone]  Chronic Pain Problems: Organization         Address  Phone   Notes  [facility]  [phone] Patients need to be referred by their primary care doctor.    Medication Assistance: Organization         Address  Phone   Notes  [facility] Medication [facility] [address] [place], [place] [address] [phone] --Must be a resident of [facility] -- Must have NO insurance coverage whatsoever (no Medicaid/ Medicare, etc.) -- The pt. MUST have a primary care doctor that directs their care regularly and follows them in the community   [facility]  [phone]   [employer]  [phone]    Agencies that provide inexpensive medical care: Organization         Address  Phone   Notes  [place]  [phone]   [facility]    [phone]   [facility] [place] [place],  [phone]   [place] [address] [place] [phone]   [facility]    (  [phone]   [facility]    [phone]   [facility]  [address], [employer] [place] Phone:  [phone], Fax:  [phone] Hours of Operation:  9 am - 6 pm, M-F.  Also accepts Medicaid/Medicare and self-pay.  [facility] for [place] [address], [address], [place] Phone: [phone], Fax: [phone]. Hours of Operation:  8:30 am - 5:30 pm, M-F.  Also accepts Medicaid and self-pay.  [facility] [place] [address], [place] Phone: [phone]   [place], [place], [place] [phone], Ext. [phone] Mondays & Thursdays: 7-9 AM.  First 15 patients are seen on a first come, first serve basis.    [place] Providers:  Organization         Address  Phone   Notes  [facility] [address], [place] [phone] Also accepts self-pay patients.  [facility] [address], [place]  [phone]   [place], [address], [place] [phone]   [facility] [address] [place] [phone]   Lucianne Lei [address], [place]   [phone] Only accepts [place] [facility] [place] patients after they have their name applied to their card.   Self-Pay (no insurance) in [facility]:  Organization         Address  Phone   Notes  Sickle Cell Patients, [facility] [place] [phone]   [facility] [place] [phone]   [facility] [place]  [place], [place], [place] [phone]   [facility]/Dr. Osei-Bonsu  [address], [place] or [address], [place] [phone] Phone number for both [place] and [place] locations is the same.  [facility] [address], [place] [phone]   [facility] [address] [place] or [address] [phone]   [facility] [address] [place] [phone], phone; [phone], fax Sees patients 1st and 3rd Saturday of every month.  Must not qualify for public or private insurance (i.e. Medicaid, Medicare, [place] [employer] Choice, Veterans' Benefits)  Household income should be no more than 200% of the poverty level The clinic cannot treat you if you are pregnant or think you are pregnant  Sexually transmitted diseases are not treated at the clinic.

## 2018-03-07 NOTE — ED Notes (Signed)
ED Provider at bedside. Dr. Sherry Ruffing

## 2018-03-07 NOTE — ED Notes (Signed)
Pt returned from Ultrasound.

## 2018-03-07 NOTE — ED Triage Notes (Signed)
Pt here with cough, SOB and stiffness in joints.

## 2018-03-07 NOTE — ED Notes (Signed)
Patient transported to X-ray 

## 2018-03-07 NOTE — ED Notes (Signed)
Pt d/c home with spouse. Pt verbalizes understanding to use incentive spirometer as directed

## 2018-03-07 NOTE — ED Notes (Signed)
Patient transported to Ultrasound 

## 2018-03-07 NOTE — ED Provider Notes (Addendum)
[facility] EMERGENCY DEPARTMENT Provider Note   CSN: [id number] Arrival date & time: 03/07/18  1538     History   Chief Complaint Chief Complaint  Patient presents with  . Cough  . Shortness of Breath  . Stiffness in joints    HPI Sarah Williamson is a 78 y.o. 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 Patient reports resting makes her symptoms better and walking makes her symptoms worse. Patient reports associated congestion, but denies fever, chills, or night sweats. Patient reports she does not use oxygen at home. Patient reports associated right neck stiffness that radiates down her right arm. Patient also reports associated chronic lower extremity edema, but states edema is worse on right leg. Patient denies chest pain or calf pain. Patient states she sees Dr. Cathie Olden. Denies chest tightness or pressure, radiation to left arm, jaw or back, nausea, or diaphoresis. Patient states her DVT was on her right leg and became a PE in 2016. Patient states she was on Zarelto for 6 months, but now only takes ASA. Patient states no cause was identified for her PE. Patient denies a history of ACS and states she last saw Dr. Cathie Olden in June 2019. Patient states she has tried diuretics in the past without relief because they exacerbate her gout. Patient denies recent surgery or recent travel.   Patient also reports intermittent diffuse stiffness in joints and states pain is worse in the ankles bilaterally. Patient reports dryness and itching over her ankles. Patient states she has tried prednisone without relief. Patient reports pain is worse with movement. Patient reports she has had this in the past, but it has worsened over the last week. Patient states she has taken Tramadol and tylenol for the pain  with relief. Patient denies any trauma.   HPI  Past Medical History:  Diagnosis Date  . Arthritis   . Asymmetric septal hypertrophy (HCC)    WITH MILD LVOT OBSTRUCTION  . Cancer ([place])   . Chest pain, unspecified   . Heart murmur    sees nasher  . History of blood transfusion    due to chemo  . History of breast cancer   . Hot flashes   . Hypertension   . Personal history of PE (pulmonary embolism) 2015  . PONV (postoperative nausea and vomiting)   . Renal insufficiency    TRANSIENT- PROBABLY RESOLVED  . Shortness of breath dyspnea    exertion    Patient Active Problem List   Diagnosis Date Noted  . 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 HTN (hypertension) 11/10/2013  . Pulmonary embolus ([place]) 05/23/2013  . Pulmonary embolism ([place]) 05/23/2013  . History of breast cancer 05/23/2013  . 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 Heart murmur 07/14/2011    Past Surgical History:  Procedure Laterality Date  . BTL     AND 2 MISCARRIAGES; rotator cuff last june  . EYE SURGERY Bilateral    cataracts  . MASTECTOMY Bilateral    7 year ago; lymph nodes removed right side  . ROTATOR CUFF REPAIR Right   . TOTAL KNEE ARTHROPLASTY Right 01/02/2014   Procedure: RIGHT TOTAL KNEE ARTHROPLASTY;  Surgeon: Vickey Huger, MD;  Location: [facility];  Service: Orthopedics;  Laterality: Right;  . TUBAL LIGATION  OB History   No obstetric history on file.      Home Medications    Prior to Admission medications   Medication Sig Start Date End Date Taking? Authorizing Provider  acetaminophen (TYLENOL) 500 MG tablet Take 1,000 mg by mouth every 4 (four) hours as needed for mild pain or headache.    Yes [provider]  aspirin EC 81 MG tablet Take 81 mg by mouth daily.   Yes [provider]  CALCIUM CITRATE-VITAMIN D PO Take 1 tablet by mouth daily. 08/29/17  Yes [provider]  carvedilol (COREG) 25 MG tablet TAKE 1 TABLET TWICE DAILY  07/16/17  Yes Nahser, Wonda Cheng, MD  famotidine (PEPCID) 10 MG tablet Take 10 mg by mouth as needed for heartburn or indigestion.   Yes [provider]  lisinopril (PRINIVIL,ZESTRIL) 40 MG tablet Take 1 tablet (40 mg total) by mouth daily. 08/24/17  Yes Nahser, Wonda Cheng, MD  traMADol (ULTRAM) 50 MG tablet Take 50 mg by mouth every 6 (six) hours as needed (pain).    Yes [provider]    Family History Family History  Problem Relation Age of Onset  . Hypertension Mother   . Heart failure Father   . Heart attack Father   . Diabetes Maternal Grandmother   . Diabetes Maternal Grandfather   . Heart attack Paternal Grandmother     Social History Social History   Tobacco Use  . Smoking status: Never Smoker  . Smokeless tobacco: Never Used  Substance Use Topics  . Alcohol use: No  . Drug use: No     Allergies   Penicillins; Sulfa antibiotics; and Allopurinol   Review of Systems Review of Systems  Constitutional: Negative for chills, diaphoresis, fatigue, fever and unexpected weight change.  HENT: Positive for congestion and rhinorrhea. Negative for sore throat and trouble swallowing.   Eyes: Negative for visual disturbance.  Respiratory: Positive for cough and shortness of breath. Negative for chest tightness, wheezing and stridor.   Cardiovascular: Positive for leg swelling. Negative for chest pain and palpitations.  Gastrointestinal: Negative for abdominal pain, nausea and vomiting.  Endocrine: Negative for cold intolerance and heat intolerance.  Genitourinary: Negative for dysuria.  Musculoskeletal: Positive for arthralgias, back pain and neck pain.  Skin: Negative for pallor and rash.  Allergic/Immunologic: Negative for environmental allergies and food allergies.  Neurological: Negative for dizziness, syncope, speech difficulty, weakness, light-headedness and numbness.  Psychiatric/Behavioral: The patient is not nervous/anxious.     Physical Exam Updated  Vital Signs BP (!) 142/55 (BP Location: Left Arm)   Pulse 79   Temp 98.8 F (37.1 C) (Oral)   Resp (!) 24   Ht '4\' 8"'  (1.422 m)   Wt 68.9 kg   SpO2 93%   BMI 34.08 kg/m   Physical Exam Vitals signs and nursing note reviewed.  Constitutional:      General: She is not in acute distress.    Appearance: She is well-developed. She is not diaphoretic.  HENT:     Head: Normocephalic and atraumatic.     Nose: Congestion and rhinorrhea present.     Right Sinus: No maxillary sinus tenderness or frontal sinus tenderness.     Left Sinus: No maxillary sinus tenderness or frontal sinus tenderness.     Mouth/Throat:     Mouth: Mucous membranes are moist.     Pharynx: Uvula midline. No pharyngeal swelling or oropharyngeal exudate.  Neck:     Musculoskeletal: Normal range of motion and neck  supple.     Vascular: No JVD.  Cardiovascular:     Rate and Rhythm: Normal rate and regular rhythm.     Pulses: Normal pulses.          Radial pulses are 2+ on the right side and 2+ on the left side.       Dorsalis pedis pulses are 2+ on the right side and 2+ on the left side.     Heart sounds: Murmur present. No friction rub. No gallop.   Pulmonary:     Effort: Pulmonary effort is normal. Tachypnea present. No accessory muscle usage or respiratory distress.     Breath sounds: No decreased breath sounds, wheezing, rhonchi or rales.  Chest:     Chest wall: No tenderness.  Abdominal:     Palpations: Abdomen is soft.     Tenderness: There is no abdominal tenderness.  Musculoskeletal: Normal range of motion.     Right ankle: She exhibits swelling. She exhibits normal range of motion and no ecchymosis. No tenderness.     Left ankle: She exhibits swelling. She exhibits normal range of motion and no ecchymosis. No tenderness.     Cervical back: Normal. She exhibits normal range of motion, no tenderness and no bony tenderness.     Right upper arm: Normal. She exhibits no tenderness, no bony tenderness and no  swelling.     Right forearm: Normal. She exhibits no tenderness, no bony tenderness and no swelling.     Right lower leg: She exhibits no tenderness. Edema present.     Left lower leg: She exhibits no tenderness. Edema present.     Comments: Dry skin noted over ankles. Patient reports diffuse stiffness in all joints, but states it is worse in her ankles.   Skin:    General: Skin is warm.     Capillary Refill: Capillary refill takes less than 2 seconds.     Coloration: Skin is not pale.     Findings: No rash.  Neurological:     Mental Status: She is alert and oriented to person, place, and time.     ED Treatments / Results  Labs (all labs ordered are listed, but only abnormal results are displayed) Labs Reviewed  COMPREHENSIVE METABOLIC PANEL - Abnormal; Notable for the following components:      Result Value   Glucose, Bld 164 (*)    BUN 36 (*)    Creatinine, Ser 1.54 (*)    Total Protein 6.4 (*)    GFR calc non Af Amer 32 (*)    GFR calc Af Amer 38 (*)    All other components within normal limits  CBC WITH DIFFERENTIAL/PLATELET - Abnormal; Notable for the following components:   WBC 17.1 (*)    Neutro Abs 14.0 (*)    Monocytes Absolute 1.7 (*)    Abs Immature Granulocytes 0.12 (*)    All other components within normal limits  SEDIMENTATION RATE - Abnormal; Notable for the following components:   Sed Rate 34 (*)    All other components within normal limits  BRAIN NATRIURETIC PEPTIDE  URINALYSIS, ROUTINE W REFLEX MICROSCOPIC  TROPONIN I  TROPONIN I  C-REACTIVE PROTEIN    EKG EKG Interpretation  Date/Time:  Sunday March 07 2018 15:51:02 EST Ventricular Rate:  85 PR Interval:  188 QRS Duration: 72 QT Interval:  364 QTC Calculation: 433 R Axis:   45 Text Interpretation:  Normal sinus rhythm Normal ECG When compared to prior, no significant changes seen.  No STEMI Confirmed by Antony Blackbird [phone]) on 03/07/2018 6:05:45 PM   Radiology Dg Chest 2 View  Result  Date: 03/07/2018 CLINICAL DATA:  Cough and congestion for several weeks EXAM: CHEST - 2 VIEW COMPARISON:  05/23/2013 FINDINGS: Cardiac shadow is within normal limits. The lungs are well aerated bilaterally. No focal infiltrate is noted. Hiatal hernia is noted new from the prior exam. No focal effusion is noted. No bony abnormality is seen. IMPRESSION: Hiatal hernia more prominent than that seen on the prior exam. No acute abnormality noted. Electronically Signed   By: Inez Catalina M.D.   On: 03/07/2018 17:02   Ct Angio Chest Pe W/cm &/or Wo Cm  Result Date: 03/07/2018 CLINICAL DATA:  Cough and shortness of breath EXAM: CT ANGIOGRAPHY CHEST WITH CONTRAST TECHNIQUE: Multidetector CT imaging of the chest was performed using the standard protocol during bolus administration of intravenous contrast. Multiplanar CT image reconstructions and MIPs were obtained to evaluate the vascular anatomy. CONTRAST:  33m ISOVUE-370 IOPAMIDOL (ISOVUE-370) INJECTION 76% COMPARISON:  None. FINDINGS: Cardiovascular: Atherosclerotic calcifications of the thoracic aorta are noted without evidence of aneurysmal dilatation or dissection. Heart is not significantly enlarged. No coronary calcifications are seen. Pulmonary artery demonstrates a normal branching pattern without focal intraluminal filling defect to suggest pulmonary embolism. Mediastinum/Nodes: Thoracic inlet is within normal limits. No hilar or mediastinal adenopathy is noted. The esophagus is within normal limits with the exception of hiatal hernia. Lungs/Pleura: Lungs are well aerated bilaterally. Mild bibasilar atelectatic changes are noted without focal confluent infiltrate. No sizable effusion is seen. No parenchymal nodules are noted. Upper Abdomen: No acute abnormality. Musculoskeletal: Degenerative changes of the thoracic spine are noted. Review of the MIP images confirms the above findings. IMPRESSION: No evidence of pulmonary emboli. Mild bibasilar atelectasis  without sizable effusion. Electronically Signed   By: MInez CatalinaM.D.   On: 03/07/2018 19:50   [place]Venous Img Lower Bilateral  Result Date: 03/07/2018 CLINICAL DATA:  Chronic left leg swelling for several years with new onset of right leg swelling femur 3 weeks. EXAM: BILATERAL LOWER EXTREMITY VENOUS DOPPLER ULTRASOUND TECHNIQUE: Gray-scale sonography with graded compression, as well as color Doppler and duplex ultrasound were performed to evaluate the lower extremity deep venous systems from the level of the common femoral vein and including the common femoral, femoral, profunda femoral, popliteal and calf veins including the posterior tibial, peroneal and gastrocnemius veins when visible. The superficial great saphenous vein was also interrogated. Spectral Doppler was utilized to evaluate flow at rest and with distal augmentation maneuvers in the common femoral, femoral and popliteal veins. COMPARISON:  None. FINDINGS: RIGHT LOWER EXTREMITY Common Femoral Vein: No evidence of thrombus. Normal compressibility, respiratory phasicity and response to augmentation. Saphenofemoral Junction: No evidence of thrombus. Normal compressibility and flow on color Doppler imaging. Profunda Femoral Vein: No evidence of thrombus. Normal compressibility and flow on color Doppler imaging. Femoral Vein: No evidence of thrombus. Normal compressibility, respiratory phasicity and response to augmentation. Popliteal Vein: No evidence of thrombus. Normal compressibility, respiratory phasicity and response to augmentation. Calf Veins: Calf veins are not well visualized due to superficial edema. Superficial Great Saphenous Vein: No evidence of thrombus. Normal compressibility. Venous Reflux:  None. Other Findings:  None. LEFT LOWER EXTREMITY Common Femoral Vein: No evidence of thrombus. Normal compressibility, respiratory phasicity and response to augmentation. Saphenofemoral Junction: No evidence of thrombus. Normal compressibility  and flow on color Doppler imaging. Profunda Femoral Vein: No evidence of thrombus. Normal compressibility and flow on color  Doppler imaging. Femoral Vein: No evidence of thrombus. Normal compressibility, respiratory phasicity and response to augmentation. Popliteal Vein: No evidence of thrombus. Normal compressibility, respiratory phasicity and response to augmentation. Calf Veins: Calf veins are not well visualized due to superficial edema. Superficial Great Saphenous Vein: No evidence of thrombus. Normal compressibility. Venous Reflux:  None. Other Findings:  None. IMPRESSION: No evidence of deep venous thrombosis bilaterally. The calf veins are not well visualized. Lower extremity edema is noted bilaterally. Electronically Signed   By: Inez Catalina M.D.   On: 03/07/2018 18:43    Procedures Procedures (including critical care time)  Medications Ordered in ED Medications  iopamidol (ISOVUE-370) 76 % injection 100 mL (80 mLs Intravenous Contrast Given 03/07/18 1930)     Initial Impression / Assessment and Plan / ED Course  I have reviewed the triage vital signs and the nursing notes.  Pertinent labs & imaging results that were available during my care of the patient were reviewed by me and considered in my medical decision making (see chart for details).  Clinical Course as of Mar 07 2037  Nancy Fetter Mar 07, 2018  1709 CXR reveals hiatal hernia more prominent than that seen on the prior exam. No acute abnormality noted.    DG Chest 2 View [AH]  1723 Leukocytosis noted at 17.1. Suspect likely due to a viral infection.   WBC(!): 17.1 [AH]  1856 No evidence of deep venous thrombosis bilaterally. The calf veins are not well visualized.    US Venous Img Lower Bilateral [AH]  1956 No evidence of pulmonary emboli. Mild bibasilar atelectasis without sizable effusion.    CT Angio Chest PE W/Cm &/Or Wo Cm [AH]  1957 UA is unremarkable.   Urinalysis, Routine w reflex microscopic [AH]  2008 BNP is  75.7. Do not suspect acute heart failure at this time.   Brain natriuretic peptide [AH]  2009 Creatinine mildly elevated compared to baseline. Patient has a history of CKD.   Creatinine(!): 1.54 [AH]  2034 BP elevated. Will encourage patient to follow up with PCP.   BP(!): 142/55 [AH]    Clinical Course User Index [AH] Arville Lime, PA-C   Patient presents with shortness of breath and cough. Suspect symptoms likely viral in nature due to history, physical exam, and work up. Patient has been afebrile and in no acute distress while in the ER. Symptoms have been controlled without medications. CXR reveals a more prominent hiatal hernia, but no other acute abnormality noted. Patient had a negative DVT ultrasound and negative CTA. Patient had a normal EKG and 2 x negative troponins. Provided incentive spirometry for patient to use at home. Patient also reports arthralgias. ESR is elevated. This is nonspecific and symptoms could be due to viral infection. Discussed using moisturizing lotion for dry skin. Discussed taking tylenol for joint stiffness. Ecouraged patient to follow up with PCP for further assessment of arthralgias and blood pressure control. Discussed findings and plan with patient. Patient states she understands and patient agrees with plan.   Findings and plan of care discussed with supervising physician Dr. Sherry Ruffing who personally evaluated and examined this patient.   Final Clinical Impressions(s) / ED Diagnoses   Final diagnoses:  Cough  Shortness of breath  Arthralgia, unspecified joint    ED Discharge Orders    None          Arville Lime, [place] 03/07/18 2039    Tegeler, Gwenyth Allegra, MD 03/08/18 [phone]

## 2018-03-10 DIAGNOSIS — I8312 Varicose veins of left lower extremity with inflammation: Secondary | ICD-10-CM | POA: Diagnosis not present

## 2018-03-10 DIAGNOSIS — I8311 Varicose veins of right lower extremity with inflammation: Secondary | ICD-10-CM | POA: Diagnosis not present

## 2018-03-19 DIAGNOSIS — I872 Venous insufficiency (chronic) (peripheral): Secondary | ICD-10-CM | POA: Diagnosis not present

## 2018-03-25 DIAGNOSIS — I83893 Varicose veins of bilateral lower extremities with other complications: Secondary | ICD-10-CM | POA: Diagnosis not present

## 2018-03-25 DIAGNOSIS — I872 Venous insufficiency (chronic) (peripheral): Secondary | ICD-10-CM | POA: Diagnosis not present

## 2018-03-25 DIAGNOSIS — M109 Gout, unspecified: Secondary | ICD-10-CM | POA: Diagnosis not present

## 2018-03-25 DIAGNOSIS — R609 Edema, unspecified: Secondary | ICD-10-CM | POA: Diagnosis not present

## 2018-03-25 DIAGNOSIS — I10 Essential (primary) hypertension: Secondary | ICD-10-CM | POA: Diagnosis not present

## 2018-03-25 DIAGNOSIS — I8311 Varicose veins of right lower extremity with inflammation: Secondary | ICD-10-CM | POA: Diagnosis not present

## 2018-03-25 DIAGNOSIS — I8312 Varicose veins of left lower extremity with inflammation: Secondary | ICD-10-CM | POA: Diagnosis not present

## 2018-04-15 ENCOUNTER — Other Ambulatory Visit: Payer: Self-pay | Admitting: Cardiovascular Disease

## 2018-04-15 DIAGNOSIS — R011 Cardiac murmur, unspecified: Secondary | ICD-10-CM

## 2018-04-15 DIAGNOSIS — I878 Other specified disorders of veins: Secondary | ICD-10-CM | POA: Diagnosis not present

## 2018-04-15 DIAGNOSIS — Z6831 Body mass index (BMI) 31.0-31.9, adult: Secondary | ICD-10-CM | POA: Diagnosis not present

## 2018-04-15 DIAGNOSIS — Z86718 Personal history of other venous thrombosis and embolism: Secondary | ICD-10-CM | POA: Diagnosis not present

## 2018-04-15 DIAGNOSIS — R600 Localized edema: Secondary | ICD-10-CM | POA: Diagnosis not present

## 2018-04-15 DIAGNOSIS — E669 Obesity, unspecified: Secondary | ICD-10-CM | POA: Diagnosis not present

## 2018-04-15 DIAGNOSIS — M109 Gout, unspecified: Secondary | ICD-10-CM | POA: Diagnosis not present

## 2018-04-15 DIAGNOSIS — I10 Essential (primary) hypertension: Secondary | ICD-10-CM

## 2018-04-19 DIAGNOSIS — M109 Gout, unspecified: Secondary | ICD-10-CM | POA: Diagnosis not present

## 2018-04-19 DIAGNOSIS — R600 Localized edema: Secondary | ICD-10-CM | POA: Diagnosis not present

## 2018-05-06 DIAGNOSIS — I8311 Varicose veins of right lower extremity with inflammation: Secondary | ICD-10-CM | POA: Diagnosis not present

## 2018-05-06 DIAGNOSIS — I8312 Varicose veins of left lower extremity with inflammation: Secondary | ICD-10-CM | POA: Diagnosis not present

## 2018-05-06 DIAGNOSIS — I83893 Varicose veins of bilateral lower extremities with other complications: Secondary | ICD-10-CM | POA: Diagnosis not present

## 2018-05-13 DIAGNOSIS — R600 Localized edema: Secondary | ICD-10-CM | POA: Diagnosis not present

## 2018-05-13 DIAGNOSIS — I878 Other specified disorders of veins: Secondary | ICD-10-CM | POA: Diagnosis not present

## 2018-05-13 DIAGNOSIS — M109 Gout, unspecified: Secondary | ICD-10-CM | POA: Diagnosis not present

## 2018-05-13 DIAGNOSIS — Z86718 Personal history of other venous thrombosis and embolism: Secondary | ICD-10-CM | POA: Diagnosis not present

## 2018-05-14 DIAGNOSIS — M109 Gout, unspecified: Secondary | ICD-10-CM | POA: Diagnosis not present

## 2018-06-21 DIAGNOSIS — M109 Gout, unspecified: Secondary | ICD-10-CM | POA: Diagnosis not present

## 2018-06-24 DIAGNOSIS — M109 Gout, unspecified: Secondary | ICD-10-CM | POA: Diagnosis not present

## 2018-06-24 DIAGNOSIS — R7303 Prediabetes: Secondary | ICD-10-CM | POA: Diagnosis not present

## 2018-06-24 DIAGNOSIS — Z0000 Encounter for general adult medical examination without abnormal findings: Secondary | ICD-10-CM | POA: Diagnosis not present

## 2018-06-24 DIAGNOSIS — I10 Essential (primary) hypertension: Secondary | ICD-10-CM | POA: Diagnosis not present

## 2018-06-24 DIAGNOSIS — Z1389 Encounter for screening for other disorder: Secondary | ICD-10-CM | POA: Diagnosis not present

## 2018-06-24 DIAGNOSIS — M1990 Unspecified osteoarthritis, unspecified site: Secondary | ICD-10-CM | POA: Diagnosis not present

## 2018-06-24 DIAGNOSIS — N183 Chronic kidney disease, stage 3 (moderate): Secondary | ICD-10-CM | POA: Diagnosis not present

## 2018-06-24 DIAGNOSIS — K219 Gastro-esophageal reflux disease without esophagitis: Secondary | ICD-10-CM | POA: Diagnosis not present

## 2018-06-24 DIAGNOSIS — E782 Mixed hyperlipidemia: Secondary | ICD-10-CM | POA: Diagnosis not present

## 2018-06-28 ENCOUNTER — Other Ambulatory Visit: Payer: Self-pay | Admitting: Cardiovascular Disease

## 2018-06-28 MED ORDER — CARVEDILOL 25 MG PO TABS: 25.0000 mg | ORAL_TABLET | Freq: Two times a day (BID) | ORAL | 0 refills | Status: DC

## 2018-06-28 NOTE — Telephone Encounter (Signed)
°*  STAT* If patient is at the pharmacy, call can be transferred to refill team.   1. Which medications need to be refilled? (please list name of each medication and dose if known)  carvedilol (COREG) 25 MG tablet  2. Which pharmacy/location (including street and city if local pharmacy) is medication to be sent to? [place], [place]  3. Do they need a 30 day or 90 day supply? [place]

## 2018-06-28 NOTE — Telephone Encounter (Signed)
Pt's medication was sent to pt's pharmacy as requested. Confirmation received.  °

## 2018-06-29 DIAGNOSIS — I8311 Varicose veins of right lower extremity with inflammation: Secondary | ICD-10-CM | POA: Diagnosis not present

## 2018-06-29 DIAGNOSIS — I8312 Varicose veins of left lower extremity with inflammation: Secondary | ICD-10-CM | POA: Diagnosis not present

## 2018-06-30 DIAGNOSIS — R7303 Prediabetes: Secondary | ICD-10-CM | POA: Diagnosis not present

## 2018-06-30 DIAGNOSIS — M85832 Other specified disorders of bone density and structure, left forearm: Secondary | ICD-10-CM | POA: Diagnosis not present

## 2018-06-30 DIAGNOSIS — R790 Abnormal level of blood mineral: Secondary | ICD-10-CM | POA: Diagnosis not present

## 2018-06-30 DIAGNOSIS — E782 Mixed hyperlipidemia: Secondary | ICD-10-CM | POA: Diagnosis not present

## 2018-06-30 DIAGNOSIS — R252 Cramp and spasm: Secondary | ICD-10-CM | POA: Diagnosis not present

## 2018-07-13 DIAGNOSIS — R600 Localized edema: Secondary | ICD-10-CM | POA: Diagnosis not present

## 2018-07-13 DIAGNOSIS — I878 Other specified disorders of veins: Secondary | ICD-10-CM | POA: Diagnosis not present

## 2018-07-13 DIAGNOSIS — Z86718 Personal history of other venous thrombosis and embolism: Secondary | ICD-10-CM | POA: Diagnosis not present

## 2018-07-13 DIAGNOSIS — M109 Gout, unspecified: Secondary | ICD-10-CM | POA: Diagnosis not present

## 2018-08-02 DIAGNOSIS — R790 Abnormal level of blood mineral: Secondary | ICD-10-CM | POA: Diagnosis not present

## 2018-10-04 ENCOUNTER — Other Ambulatory Visit: Payer: Self-pay | Admitting: *Deleted

## 2018-10-04 ENCOUNTER — Telehealth: Payer: Self-pay | Admitting: *Deleted

## 2018-10-04 NOTE — Telephone Encounter (Signed)

## 2018-10-06 ENCOUNTER — Other Ambulatory Visit: Payer: Self-pay

## 2018-10-06 ENCOUNTER — Telehealth (INDEPENDENT_AMBULATORY_CARE_PROVIDER_SITE_OTHER): Payer: Medicare HMO | Admitting: Cardiovascular Disease

## 2018-10-06 VITALS — BP 131/74 | HR 74 | Wt 150.0 lb

## 2018-10-06 DIAGNOSIS — I10 Essential (primary) hypertension: Secondary | ICD-10-CM | POA: Diagnosis not present

## 2018-10-06 DIAGNOSIS — Z7189 Other specified counseling: Secondary | ICD-10-CM

## 2018-10-06 MED ORDER — LOSARTAN POTASSIUM 100 MG PO TABS: 100.0000 mg | ORAL_TABLET | Freq: Every day | ORAL | 3 refills | Status: DC

## 2018-10-06 NOTE — Patient Instructions (Signed)
Medication Instructions:  Your physician has recommended you make the following change in your medication:  1.) stop lisinopril 2.) start losartan 100 mg once a day  If you need a refill on your cardiac medications before your next appointment, please call your pharmacy.   Lab work: Your physician recommends that you return for lab work in: 3 weeks (BMET)   Testing/Procedures: none  Follow-Up: At [facility], you and your health needs are our priority.  As part of our continuing mission to provide you with exceptional heart care, we have created designated Provider Care Teams.  These Care Teams include your primary Cardiologist (physician) and Advanced Practice Providers (APPs -  Physician Assistants and Nurse Practitioners) who all work together to provide you with the care you need, when you need it. You will need a follow up appointment in:  12 months.  Please call our office 2 months in advance to schedule this appointment.  You may see [name], MD or one of the following Advanced Practice Providers on your designated Care Team: Richardson Dopp, PA-C [place], [place] . Daune Perch, NP  Any Other Special Instructions Will Be Listed Below (If Applicable).

## 2018-10-06 NOTE — Progress Notes (Signed)
Virtual Visit via Video Note   This visit type was conducted due to national recommendations for restrictions regarding the COVID-19 Pandemic (e.g. social distancing) in an effort to limit this patient's exposure and mitigate transmission in our community.  Due to her co-morbid illnesses, this patient is at least at moderate risk for complications without adequate follow up.  This format is felt to be most appropriate for this patient at this time.  All issues noted in this document were discussed and addressed.  A limited physical exam was performed with this format.  Please refer to the patient's chart for her consent to telehealth for [facility].   Date:  10/06/2018   ID:  Sarah Williamson, DOB [birth year], MRN [id number]  Patient Location: Home Provider Location: Home PCP:  Antony Contras, MD  Cardiologist:  [name], MD  Electrophysiologist:  None   Problem List: 1. Hypertension 2. 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 History breast cancer 4. Spinal stenosis 5. Pulmonary embolus- treated with Xarelto     Sarah Williamson is doing well,  We  decreased her HCTZ and she  is doing well.  She still does not get much exercise.  Oct. 14, 2014:  Sarah Williamson is doing better.  She had right rotator cuff surgery and has been in rehab for most of the summer.   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    No dyspnea.    She takes her bp at home and typically get 130/ 82.    OCt. 1, 2015:  Sarah Williamson  was hospitalized in May for a pulmonary embolus. She was started on Xarelto.  She saw [name] after the hospitalization. She did not have any significant ortho problems prior to her PE.   She had been taken off her ASA ( due to gout problems)   It appears that this was an unprovoked PE.   Oct. 3, 2016:  Sarah Williamson is doing well BP has been a bit high. She had an unprovoked pulmonary embolis .  Took xarelto for 6 months.   Was managed by oncology /  hemotology .   May 14, 2015:  BP is a bit high today . Was elevated this am here in the office.  No CP or dyspnea.   May 29, 2016:  Doing well.  Not exercising as much as she should  No CP or dyspnea   June 08, 2017 :  Sarah Williamson is seen today for follow up of her HTN Has been recording her BP -  Most readings are a little elevated.  Still eating more salt than she should  Sausage biscuits in the am   September 10, 2017:  Sarah Williamson is seen today for follow up of her HTN We tried HCTZ but she developed gout BP readings at home look very good.  No cp , no dyspnea.  Is avoiding salt     Evaluation Performed:  Follow-Up Visit  Chief Complaint:  Dyspnea, HTN  Aug. 26, 2020   Sarah Williamson is a 78 y.o. female with  HTN. Has continued to have dyspnea Overall has been doing well .  VS look good.  Is not exercising .  Has arhritis in her feet Watches her salt. Has had a cough - asked about Lisinopril  Went to the ER with inability to walk and dyspnea in Jan.  Work up was negative    The patient does not have symptoms concerning for COVID-19  infection (fever, chills, cough, or new shortness of breath).    Past Medical History:  Diagnosis Date  . Arthritis   . Asymmetric septal hypertrophy (HCC)    WITH MILD LVOT OBSTRUCTION  . Cancer ([place])   . Chest pain, unspecified   . Heart murmur    sees nasher  . History of blood transfusion    due to chemo  . History of breast cancer   . Hot flashes   . Hypertension   . Personal history of PE (pulmonary embolism) 2015  . PONV (postoperative nausea and vomiting)   . Renal insufficiency    TRANSIENT- PROBABLY RESOLVED  . Shortness of breath dyspnea    exertion   Past Surgical History:  Procedure Laterality Date  . BTL     AND 2 MISCARRIAGES; rotator cuff last june  . EYE SURGERY Bilateral    cataracts  . MASTECTOMY Bilateral    7 year ago; lymph nodes removed right side  . ROTATOR CUFF REPAIR Right   . TOTAL KNEE  ARTHROPLASTY Right 01/02/2014   Procedure: RIGHT TOTAL KNEE ARTHROPLASTY;  Surgeon: Vickey Huger, MD;  Location: [facility];  Service: Orthopedics;  Laterality: Right;  . TUBAL LIGATION       No outpatient medications have been marked as taking for the 10/06/18 encounter (Telemedicine) with , Wonda Cheng, MD.     Allergies:   Penicillins, Sulfa antibiotics, and Allopurinol   Social History   Tobacco Use  . Smoking status: Never Smoker  . Smokeless tobacco: Never Used  Substance Use Topics  . Alcohol use: No  . Drug use: No     Family Hx: The patient's family history includes Diabetes in her maternal grandfather and maternal grandmother; Heart attack in her father and paternal grandmother; Heart failure in her father; Hypertension in her mother.  ROS:   Please see the history of present illness.     All other systems reviewed and are negative.   Prior CV studies:   The following studies were reviewed today:    Labs/Other Tests and Data Reviewed:    EKG:  No ECG reviewed.  Recent Labs: 03/07/2018: ALT 15; B Natriuretic Peptide 75.7; BUN 36; Creatinine, Ser 1.54; Hemoglobin 12.7; Platelets 229; Potassium 4.0; Sodium 135   Recent Lipid Panel Lab Results  Component Value Date/Time   CHOL 143 11/23/2012 10:25 AM   TRIG 100.0 11/23/2012 10:25 AM   HDL 56.10 11/23/2012 10:25 AM   CHOLHDL 3 11/23/2012 10:25 AM   LDLCALC 67 11/23/2012 10:25 AM    Wt Readings from Last 3 Encounters:  10/06/18 150 lb (68 kg)  03/07/18 152 lb (68.9 kg)  09/10/17 156 lb 6.4 oz (70.9 kg)     Objective:    Vital Signs:  BP 131/74   Pulse 74   Wt 150 lb (68 kg)   BMI 33.63 kg/m    VITAL SIGNS:  reviewed GEN:  no acute distress EYES:  sclerae anicteric, EOMI - Extraocular Movements Intact RESPIRATORY:  normal respiratory effort, symmetric expansion CARDIOVASCULAR:  no peripheral edema SKIN:  no rash, lesions or ulcers. MUSCULOSKELETAL:  no obvious deformities. NEURO:  alert and  oriented x 3, no obvious focal deficit PSYCH:  normal affect  ASSESSMENT & PLAN:    1. Hypertension: Sarah Williamson is doing well.  Her blood pressure is well controlled.  She has developed a cough might be due to central.  We will discontinue the lisinopril and start her on less than 100 mg a day.  The prescription will go to [facility].  We will draw a basic metabolic profile in 3 weeks.  I will see her again in 1 year.    2.  Chronic shortness of breath: Sarah Williamson  has a history of obesity and is very inactive.  She does not exercise mostly because of arthritis issues.  I encouraged her to look into getting an elliptical machine or stationary bike to help with developing an exercise program.  Echocardiogram from April, 2015 reveals normal/hyperdynamic left ventricular systolic function with an ejection fraction of 65 to 70%.  She does have some diastolic dysfunction.  I do not think that her shortness of breath is necessarily due to congestive heart failure.  She presented to the emergency room in January of this year and her BNP was normal.  COVID-19 Education: The signs and symptoms of COVID-19 were discussed with the patient and how to seek care for testing (follow up with PCP or arrange E-visit).  The importance of social distancing was discussed today.  Time:   Today, I have spent 19 minutes with the patient with telehealth technology discussing the above problems.     Medication Adjustments/Labs and Tests Ordered: Current medicines are reviewed at length with the patient today.  Concerns regarding medicines are outlined above.   Tests Ordered: No orders of the defined types were placed in this encounter.   Medication Changes: No orders of the defined types were placed in this encounter.   Follow Up:  In Person in 1 year(s)  Signed, [name], MD  10/06/2018 8:38 AM    [place]

## 2018-10-13 DIAGNOSIS — E669 Obesity, unspecified: Secondary | ICD-10-CM | POA: Diagnosis not present

## 2018-10-13 DIAGNOSIS — M109 Gout, unspecified: Secondary | ICD-10-CM | POA: Diagnosis not present

## 2018-10-13 DIAGNOSIS — I878 Other specified disorders of veins: Secondary | ICD-10-CM | POA: Diagnosis not present

## 2018-10-13 DIAGNOSIS — R600 Localized edema: Secondary | ICD-10-CM | POA: Diagnosis not present

## 2018-10-13 DIAGNOSIS — Z86718 Personal history of other venous thrombosis and embolism: Secondary | ICD-10-CM | POA: Diagnosis not present

## 2018-10-13 DIAGNOSIS — M24521 Contracture, right elbow: Secondary | ICD-10-CM | POA: Diagnosis not present

## 2018-10-13 DIAGNOSIS — M24522 Contracture, left elbow: Secondary | ICD-10-CM | POA: Diagnosis not present

## 2018-10-13 DIAGNOSIS — Z6832 Body mass index (BMI) 32.0-32.9, adult: Secondary | ICD-10-CM | POA: Diagnosis not present

## 2018-10-27 ENCOUNTER — Other Ambulatory Visit: Payer: Self-pay

## 2018-10-27 ENCOUNTER — Other Ambulatory Visit: Payer: Medicare HMO | Admitting: *Deleted

## 2018-10-27 DIAGNOSIS — I10 Essential (primary) hypertension: Secondary | ICD-10-CM | POA: Diagnosis not present

## 2018-10-27 LAB — BASIC METABOLIC PANEL
BUN/Creatinine Ratio: 25 (ref 12–28)
BUN: 32 mg/dL — ABNORMAL HIGH (ref 8–27)
CO2: 23 mmol/L (ref 20–29)
Calcium: 10.5 mg/dL — ABNORMAL HIGH (ref 8.7–10.3)
Chloride: 104 mmol/L (ref 96–106)
Creatinine, Ser: 1.28 mg/dL — ABNORMAL HIGH (ref 0.57–1.00)
GFR calc Af Amer: 47 mL/min/{1.73_m2} — ABNORMAL LOW (ref >59)
GFR calc non Af Amer: 40 mL/min/{1.73_m2} — ABNORMAL LOW (ref >59)
Glucose: 121 mg/dL — ABNORMAL HIGH (ref 65–99)
Potassium: 4.8 mmol/L (ref 3.5–5.2)
Sodium: 145 mmol/L — ABNORMAL HIGH (ref 134–144)

## 2018-11-09 ENCOUNTER — Other Ambulatory Visit: Payer: Self-pay | Admitting: Cardiovascular Disease

## 2018-12-28 DIAGNOSIS — E782 Mixed hyperlipidemia: Secondary | ICD-10-CM | POA: Diagnosis not present

## 2018-12-28 DIAGNOSIS — E559 Vitamin D deficiency, unspecified: Secondary | ICD-10-CM | POA: Diagnosis not present

## 2018-12-28 DIAGNOSIS — M85832 Other specified disorders of bone density and structure, left forearm: Secondary | ICD-10-CM | POA: Diagnosis not present

## 2018-12-28 DIAGNOSIS — N1831 Chronic kidney disease, stage 3a: Secondary | ICD-10-CM | POA: Diagnosis not present

## 2018-12-28 DIAGNOSIS — M109 Gout, unspecified: Secondary | ICD-10-CM | POA: Diagnosis not present

## 2018-12-28 DIAGNOSIS — K219 Gastro-esophageal reflux disease without esophagitis: Secondary | ICD-10-CM | POA: Diagnosis not present

## 2018-12-28 DIAGNOSIS — I10 Essential (primary) hypertension: Secondary | ICD-10-CM | POA: Diagnosis not present

## 2018-12-28 DIAGNOSIS — R7303 Prediabetes: Secondary | ICD-10-CM | POA: Diagnosis not present

## 2018-12-28 DIAGNOSIS — M1990 Unspecified osteoarthritis, unspecified site: Secondary | ICD-10-CM | POA: Diagnosis not present

## 2019-01-05 DIAGNOSIS — C50919 Malignant neoplasm of unspecified site of unspecified female breast: Secondary | ICD-10-CM | POA: Diagnosis not present

## 2019-01-05 DIAGNOSIS — E782 Mixed hyperlipidemia: Secondary | ICD-10-CM | POA: Diagnosis not present

## 2019-01-05 DIAGNOSIS — I10 Essential (primary) hypertension: Secondary | ICD-10-CM | POA: Diagnosis not present

## 2019-01-05 DIAGNOSIS — N183 Chronic kidney disease, stage 3 unspecified: Secondary | ICD-10-CM | POA: Diagnosis not present

## 2019-01-05 DIAGNOSIS — M1990 Unspecified osteoarthritis, unspecified site: Secondary | ICD-10-CM | POA: Diagnosis not present

## 2019-01-13 DIAGNOSIS — M24522 Contracture, left elbow: Secondary | ICD-10-CM | POA: Diagnosis not present

## 2019-01-13 DIAGNOSIS — I878 Other specified disorders of veins: Secondary | ICD-10-CM | POA: Diagnosis not present

## 2019-01-13 DIAGNOSIS — E669 Obesity, unspecified: Secondary | ICD-10-CM | POA: Diagnosis not present

## 2019-01-13 DIAGNOSIS — R600 Localized edema: Secondary | ICD-10-CM | POA: Diagnosis not present

## 2019-01-13 DIAGNOSIS — Z6831 Body mass index (BMI) 31.0-31.9, adult: Secondary | ICD-10-CM | POA: Diagnosis not present

## 2019-01-13 DIAGNOSIS — Z86718 Personal history of other venous thrombosis and embolism: Secondary | ICD-10-CM | POA: Diagnosis not present

## 2019-01-13 DIAGNOSIS — M109 Gout, unspecified: Secondary | ICD-10-CM | POA: Diagnosis not present

## 2019-01-13 DIAGNOSIS — M24521 Contracture, right elbow: Secondary | ICD-10-CM | POA: Diagnosis not present

## 2019-03-11 ENCOUNTER — Ambulatory Visit: Payer: Medicare HMO

## 2019-03-19 ENCOUNTER — Ambulatory Visit: Payer: Medicare HMO

## 2019-03-30 DIAGNOSIS — C50919 Malignant neoplasm of unspecified site of unspecified female breast: Secondary | ICD-10-CM | POA: Diagnosis not present

## 2019-03-30 DIAGNOSIS — N183 Chronic kidney disease, stage 3 (moderate): Secondary | ICD-10-CM | POA: Diagnosis not present

## 2019-03-30 DIAGNOSIS — E782 Mixed hyperlipidemia: Secondary | ICD-10-CM | POA: Diagnosis not present

## 2019-03-30 DIAGNOSIS — I10 Essential (primary) hypertension: Secondary | ICD-10-CM | POA: Diagnosis not present

## 2019-03-30 DIAGNOSIS — Z853 Personal history of malignant neoplasm of breast: Secondary | ICD-10-CM | POA: Diagnosis not present

## 2019-03-30 DIAGNOSIS — M1990 Unspecified osteoarthritis, unspecified site: Secondary | ICD-10-CM | POA: Diagnosis not present

## 2019-04-06 DIAGNOSIS — H524 Presbyopia: Secondary | ICD-10-CM | POA: Diagnosis not present

## 2019-04-06 DIAGNOSIS — Z0100 Encounter for examination of eyes and vision without abnormal findings: Secondary | ICD-10-CM | POA: Diagnosis not present

## 2019-04-07 ENCOUNTER — Ambulatory Visit
Admission: RE | Admit: 2019-04-07 | Discharge: 2019-04-07 | Disposition: A | Discharge status: Home / Self Care | Payer: Medicare HMO | Source: Ambulatory Visit | Attending: Family Medicine | Admitting: Family Medicine

## 2019-04-07 ENCOUNTER — Other Ambulatory Visit: Payer: Self-pay | Admitting: Family Medicine

## 2019-04-07 DIAGNOSIS — R0789 Other chest pain: Secondary | ICD-10-CM

## 2019-04-07 DIAGNOSIS — I10 Essential (primary) hypertension: Secondary | ICD-10-CM | POA: Diagnosis not present

## 2019-04-07 DIAGNOSIS — Z853 Personal history of malignant neoplasm of breast: Secondary | ICD-10-CM | POA: Diagnosis not present

## 2019-04-07 DIAGNOSIS — R0781 Pleurodynia: Secondary | ICD-10-CM | POA: Diagnosis not present

## 2019-04-07 DIAGNOSIS — F439 Reaction to severe stress, unspecified: Secondary | ICD-10-CM | POA: Diagnosis not present

## 2019-05-09 IMAGING — CR DG CHEST 2V
2 series · 2 of 2 positions shown · non-contrast
Comparison: 05/23/2013

CLINICAL DATA: Cough and congestion for several weeks

EXAM:
CHEST - 2 VIEW

[w chest pa]
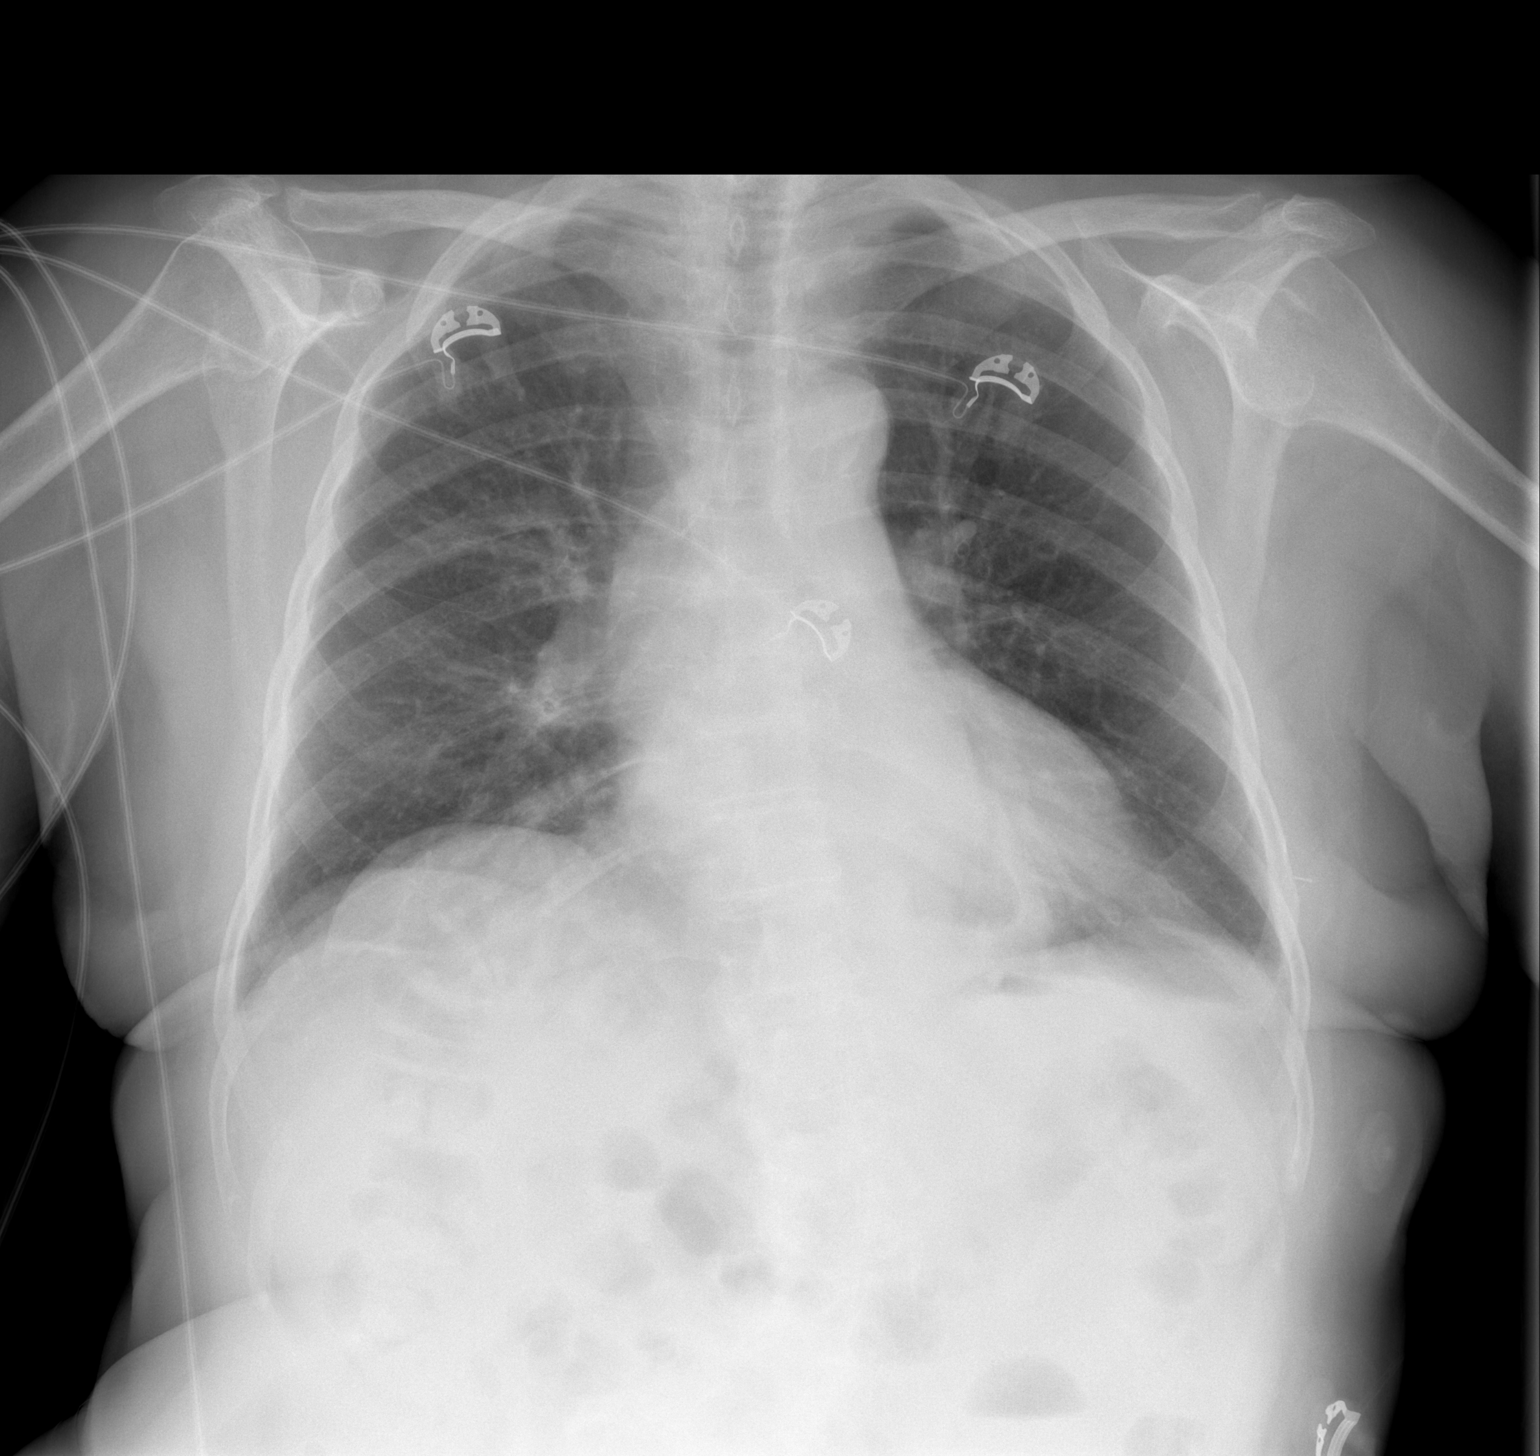

[w chest lat]
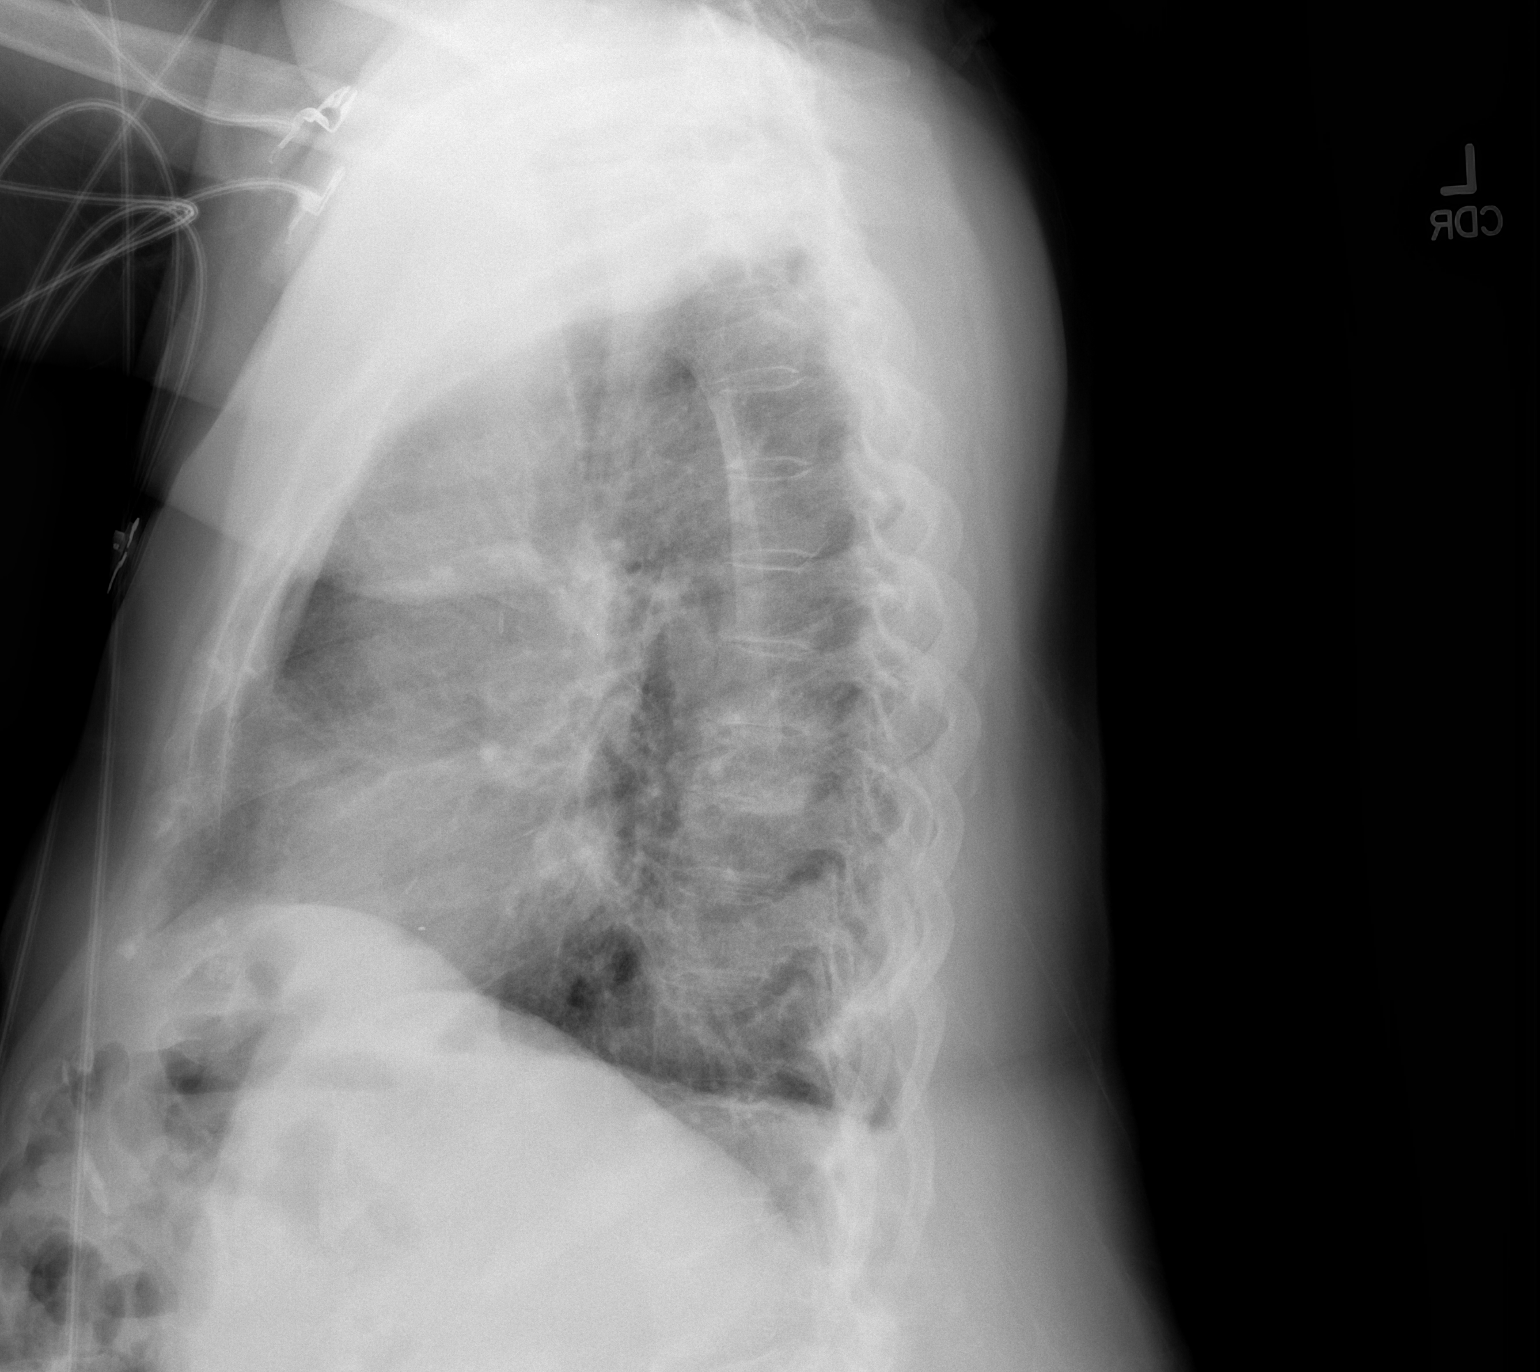

[2 of 2 positions shown; findings below may reference images not displayed]

FINDINGS: Cardiac shadow is within normal limits. The lungs are well aerated
bilaterally. No focal infiltrate is noted. Hiatal hernia is noted
new from the prior exam. No focal effusion is noted. No bony
abnormality is seen.
IMPRESSION: Hiatal hernia more prominent than that seen on the prior exam. No
acute abnormality noted.

## 2019-06-28 DIAGNOSIS — R7303 Prediabetes: Secondary | ICD-10-CM | POA: Diagnosis not present

## 2019-06-28 DIAGNOSIS — E782 Mixed hyperlipidemia: Secondary | ICD-10-CM | POA: Diagnosis not present

## 2019-06-28 DIAGNOSIS — J069 Acute upper respiratory infection, unspecified: Secondary | ICD-10-CM | POA: Diagnosis not present

## 2019-06-28 DIAGNOSIS — M10079 Idiopathic gout, unspecified ankle and foot: Secondary | ICD-10-CM | POA: Diagnosis not present

## 2019-06-28 DIAGNOSIS — I10 Essential (primary) hypertension: Secondary | ICD-10-CM | POA: Diagnosis not present

## 2019-06-28 DIAGNOSIS — N1831 Chronic kidney disease, stage 3a: Secondary | ICD-10-CM | POA: Diagnosis not present

## 2019-06-28 DIAGNOSIS — C50919 Malignant neoplasm of unspecified site of unspecified female breast: Secondary | ICD-10-CM | POA: Diagnosis not present

## 2019-06-28 DIAGNOSIS — Z0000 Encounter for general adult medical examination without abnormal findings: Secondary | ICD-10-CM | POA: Diagnosis not present

## 2019-06-28 DIAGNOSIS — K219 Gastro-esophageal reflux disease without esophagitis: Secondary | ICD-10-CM | POA: Diagnosis not present

## 2019-06-28 DIAGNOSIS — M1990 Unspecified osteoarthritis, unspecified site: Secondary | ICD-10-CM | POA: Diagnosis not present

## 2019-06-28 DIAGNOSIS — E559 Vitamin D deficiency, unspecified: Secondary | ICD-10-CM | POA: Diagnosis not present

## 2019-07-01 DIAGNOSIS — H353132 Nonexudative age-related macular degeneration, bilateral, intermediate dry stage: Secondary | ICD-10-CM | POA: Diagnosis not present

## 2019-07-14 DIAGNOSIS — R600 Localized edema: Secondary | ICD-10-CM | POA: Diagnosis not present

## 2019-07-14 DIAGNOSIS — E669 Obesity, unspecified: Secondary | ICD-10-CM | POA: Diagnosis not present

## 2019-07-14 DIAGNOSIS — M24521 Contracture, right elbow: Secondary | ICD-10-CM | POA: Diagnosis not present

## 2019-07-14 DIAGNOSIS — I878 Other specified disorders of veins: Secondary | ICD-10-CM | POA: Diagnosis not present

## 2019-07-14 DIAGNOSIS — Z86718 Personal history of other venous thrombosis and embolism: Secondary | ICD-10-CM | POA: Diagnosis not present

## 2019-07-14 DIAGNOSIS — Z6831 Body mass index (BMI) 31.0-31.9, adult: Secondary | ICD-10-CM | POA: Diagnosis not present

## 2019-07-14 DIAGNOSIS — M24522 Contracture, left elbow: Secondary | ICD-10-CM | POA: Diagnosis not present

## 2019-07-14 DIAGNOSIS — M109 Gout, unspecified: Secondary | ICD-10-CM | POA: Diagnosis not present

## 2019-09-05 DIAGNOSIS — N183 Chronic kidney disease, stage 3 unspecified: Secondary | ICD-10-CM | POA: Diagnosis not present

## 2019-09-05 DIAGNOSIS — E782 Mixed hyperlipidemia: Secondary | ICD-10-CM | POA: Diagnosis not present

## 2019-09-05 DIAGNOSIS — I10 Essential (primary) hypertension: Secondary | ICD-10-CM | POA: Diagnosis not present

## 2019-09-05 DIAGNOSIS — Z853 Personal history of malignant neoplasm of breast: Secondary | ICD-10-CM | POA: Diagnosis not present

## 2019-09-05 DIAGNOSIS — M1990 Unspecified osteoarthritis, unspecified site: Secondary | ICD-10-CM | POA: Diagnosis not present

## 2019-09-05 DIAGNOSIS — C50919 Malignant neoplasm of unspecified site of unspecified female breast: Secondary | ICD-10-CM | POA: Diagnosis not present

## 2019-09-09 ENCOUNTER — Other Ambulatory Visit: Payer: Self-pay | Admitting: Cardiovascular Disease

## 2019-10-10 ENCOUNTER — Other Ambulatory Visit: Payer: Self-pay

## 2019-10-10 ENCOUNTER — Encounter: Payer: Self-pay | Admitting: Cardiovascular Disease

## 2019-10-10 ENCOUNTER — Ambulatory Visit: Payer: Medicare HMO | Admitting: Cardiovascular Disease

## 2019-10-10 VITALS — BP 150/76 | HR 81 | Ht <= 58 in | Wt 154.8 lb

## 2019-10-10 DIAGNOSIS — I422 Other hypertrophic cardiomyopathy: Secondary | ICD-10-CM

## 2019-10-10 DIAGNOSIS — I10 Essential (primary) hypertension: Secondary | ICD-10-CM | POA: Diagnosis not present

## 2019-10-10 DIAGNOSIS — Q248 Other specified congenital malformations of heart: Secondary | ICD-10-CM | POA: Diagnosis not present

## 2019-10-10 MED ORDER — CARVEDILOL 25 MG PO TABS: 25.0000 mg | ORAL_TABLET | Freq: Two times a day (BID) | ORAL | 3 refills | Status: DC

## 2019-10-10 NOTE — Patient Instructions (Signed)
Medication Instructions:  Your physician recommends that you continue on your current medications as directed. Please refer to the Current Medication list given to you today.  *If you need a refill on your cardiac medications before your next appointment, please call your pharmacy*   Lab Work: None Ordered If you have labs (blood work) drawn today and your tests are completely normal, you will receive your results only by: Marland Kitchen MyChart Message (if you have MyChart) OR . A paper copy in the mail If you have any lab test that is abnormal or we need to change your treatment, we will call you to review the results.    Testing/Procedures: None Ordered    Follow-Up: At [facility], you and your health needs are our priority.  As part of our continuing mission to provide you with exceptional heart care, we have created designated Provider Care Teams.  These Care Teams include your primary Cardiologist (physician) and Advanced Practice Providers (APPs -  Physician Assistants and Nurse Practitioners) who all work together to provide you with the care you need, when you need it.   Your next appointment:   6 month(s)  The format for your next appointment:   In Person  Provider:   Richardson Dopp, PA-C or Robbie Lis, PA-C

## 2019-10-10 NOTE — Progress Notes (Signed)
Sarah Williamson Date of Birth  10/16/1940       [facility]      [address], [place]    [place], [place]  [address]    [phone]       Fax  [phone]       Problem List: 1. Hypertension 2. 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 History breast cancer 4. Spinal stenosis 5. Pulmonary embolus- treated with Xarelto     Sarah Williamson is doing well,  We  decreased her HCTZ and she  is doing well.  She still does not get much exercise.  Oct. 14, 2014:  Sarah Williamson is doing better.  She had right rotator cuff surgery and has been in rehab for most of the summer.   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    No dyspnea.    She takes her bp at home and typically get 130/ 82.    OCt. 1, 2015:  Sarah Williamson  was hospitalized in May for a pulmonary embolus. She was started on Xarelto.  She saw [name] after the hospitalization. She did not have any significant ortho problems prior to her PE.   She had been taken off her ASA ( due to gout problems)   It appears that this was an unprovoked PE.   Oct. 3, 2016:  Sarah Williamson is doing well BP has been a bit high. She had an unprovoked pulmonary embolis .  Took xarelto for 6 months.   Was managed by oncology / hemotology .   May 14, 2015:  BP is a bit high today . Was elevated this am here in the office.  No CP or dyspnea.   May 29, 2016:  Doing well.  Not exercising as much as she should  No CP or dyspnea   June 08, 2017 :  Sarah Williamson is seen today for follow up of her HTN Has been recording her BP -  Most readings are a little elevated.  Still eating more salt than she should  Sausage biscuits in the am   September 10, 2017:  Sarah Williamson is seen today for follow up of her HTN We tried HCTZ but she developed gout BP readings at home look very good.  No cp , no dyspnea.  Is avoiding salt   October 10, 2019: Sarah Williamson seen back today after 2-year in person absence.   We had a video visit last year. Still  short of breath .  She admits that her weight is playing a role in this  Is not getting any regular exercise due to back issues Admits that she is deconditioned.  BP is elevated.     Eats bacon and sausage twice a week  We have tried HCTZ in the past but she developed worsening creatinine .  Has been on prednisone for gout . Is already on allopurinol.  Is not on colchicine    Current Outpatient Medications on File Prior to Visit  Medication Sig Dispense Refill  . acetaminophen (TYLENOL) 500 MG tablet Take 1,000 mg by mouth every 4 (four) hours as needed for mild pain or headache.     . allopurinol (ZYLOPRIM) 100 MG tablet Take 200 mg by mouth 2 (two) times daily.    Marland Kitchen aspirin EC 81 MG tablet Take 81 mg by mouth daily.    Marland Kitchen CALCIUM CITRATE-VITAMIN D PO Take 1 tablet by mouth daily.    . carvedilol (COREG)  25 MG tablet Take 1 tablet (25 mg total) by mouth 2 (two) times daily. 180 tablet 3  . Cholecalciferol (VITAMIN D) 50 MCG (2000 UT) CAPS Take 1 capsule by mouth daily.    . cycloSPORINE (RESTASIS OP) Place 1 drop into both eyes 2 (two) times daily.    . famotidine (PEPCID) 10 MG tablet Take 10 mg by mouth as needed for heartburn or indigestion.    Marland Kitchen losartan (COZAAR) 100 MG tablet Take 1 tablet by mouth once daily 90 tablet 0  . Magnesium 400 MG TABS Take 1 tablet by mouth daily.    . predniSONE (DELTASONE) 5 MG tablet Take 5 mg by mouth as directed. 12 days only    . traMADol (ULTRAM) 50 MG tablet Take 50 mg by mouth every 6 (six) hours as needed (pain).     . Turmeric 500 MG CAPS Take 1 capsule by mouth daily.    Marland Kitchen UNABLE TO FIND Take 1 capsule by mouth daily. HORSE CHESTNUTT     No current facility-administered medications on file prior to visit.    Allergies  Allergen Reactions  . Lisinopril Cough  . Penicillins Other (See Comments)    Occurred as a child; unknown reaction  . Sulfa Antibiotics   . Allopurinol Rash    Past Medical History:  Diagnosis Date  . Arthritis   .  Asymmetric septal hypertrophy (HCC)    WITH MILD LVOT OBSTRUCTION  . Cancer ([place])   . Chest pain, unspecified   . Heart murmur    sees nasher  . History of blood transfusion    due to chemo  . History of breast cancer   . Hot flashes   . Hypertension   . Personal history of PE (pulmonary embolism) 2015  . PONV (postoperative nausea and vomiting)   . Renal insufficiency    TRANSIENT- PROBABLY RESOLVED  . Shortness of breath dyspnea    exertion    Past Surgical History:  Procedure Laterality Date  . BTL     AND 2 MISCARRIAGES; rotator cuff last june  . EYE SURGERY Bilateral    cataracts  . MASTECTOMY Bilateral    7 year ago; lymph nodes removed right side  . ROTATOR CUFF REPAIR Right   . TOTAL KNEE ARTHROPLASTY Right 01/02/2014   Procedure: RIGHT TOTAL KNEE ARTHROPLASTY;  Surgeon: Vickey Huger, MD;  Location: [facility];  Service: Orthopedics;  Laterality: Right;  . TUBAL LIGATION      Social History   Tobacco Use  Smoking Status Never Smoker  Smokeless Tobacco Never Used    Social History   Substance and Sexual Activity  Alcohol Use No    Family History  Problem Relation Age of Onset  . Hypertension Mother   . Heart failure Father   . Heart attack Father   . Diabetes Maternal Grandmother   . Diabetes Maternal Grandfather   . Heart attack Paternal Grandmother     Sarah Williamson of Systems:  Noted in current history, otherwise negative.  Physical Exam: Blood pressure (!) 150/76, pulse 81, height 4\' 8"  (1.422 m), weight 154 lb 12.8 oz (70.2 kg), SpO2 96 %.  GEN:   Elderly , moderately obese  HEENT: Normal NECK: No JVD; No carotid bruits LYMPHATICS: No lymphadenopathy CARDIAC: RRR , soft systolic murmur  RESPIRATORY:  Clear to auscultation without rales, wheezing or rhonchi  ABDOMEN: Soft, non-tender, non-distended MUSCULOSKELETAL:  2-3+ pitting edem a SKIN: Warm and dry NEUROLOGIC:  Alert and oriented x 3  ECG: October 10, 2019: Normal sinus rhythm at 81.  No ST  or T wave changes.  Assessment / Plan:   1. Hypertension -      Still eating salty foods.   Does not exercise .    Encouraged her to use a stationary bike  2. 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 History breast cancer 4. Spinal stenosis 5. Pulmonary embolus-   - is no longer on anticoagulation  6.  Leg edema:   Advised leg elevation, salt restriction ,  Compression hose. More exercise   [name], MD  10/10/2019 9:37 AM    [facility] [place],  [place], [place]  [address] Pager [phone] Phone: [phone]; Fax: [phone]

## 2019-11-01 DIAGNOSIS — M1990 Unspecified osteoarthritis, unspecified site: Secondary | ICD-10-CM | POA: Diagnosis not present

## 2019-11-01 DIAGNOSIS — K219 Gastro-esophageal reflux disease without esophagitis: Secondary | ICD-10-CM | POA: Diagnosis not present

## 2019-11-01 DIAGNOSIS — N183 Chronic kidney disease, stage 3 unspecified: Secondary | ICD-10-CM | POA: Diagnosis not present

## 2019-11-01 DIAGNOSIS — C50919 Malignant neoplasm of unspecified site of unspecified female breast: Secondary | ICD-10-CM | POA: Diagnosis not present

## 2019-11-01 DIAGNOSIS — Z853 Personal history of malignant neoplasm of breast: Secondary | ICD-10-CM | POA: Diagnosis not present

## 2019-11-01 DIAGNOSIS — I10 Essential (primary) hypertension: Secondary | ICD-10-CM | POA: Diagnosis not present

## 2019-11-01 DIAGNOSIS — E782 Mixed hyperlipidemia: Secondary | ICD-10-CM | POA: Diagnosis not present

## 2019-12-16 ENCOUNTER — Other Ambulatory Visit: Payer: Self-pay | Admitting: Cardiovascular Disease

## 2020-01-10 DIAGNOSIS — E559 Vitamin D deficiency, unspecified: Secondary | ICD-10-CM | POA: Diagnosis not present

## 2020-01-10 DIAGNOSIS — R7303 Prediabetes: Secondary | ICD-10-CM | POA: Diagnosis not present

## 2020-01-10 DIAGNOSIS — E782 Mixed hyperlipidemia: Secondary | ICD-10-CM | POA: Diagnosis not present

## 2020-01-10 DIAGNOSIS — M10079 Idiopathic gout, unspecified ankle and foot: Secondary | ICD-10-CM | POA: Diagnosis not present

## 2020-01-10 DIAGNOSIS — I10 Essential (primary) hypertension: Secondary | ICD-10-CM | POA: Diagnosis not present

## 2020-01-10 DIAGNOSIS — M85832 Other specified disorders of bone density and structure, left forearm: Secondary | ICD-10-CM | POA: Diagnosis not present

## 2020-01-10 DIAGNOSIS — M1990 Unspecified osteoarthritis, unspecified site: Secondary | ICD-10-CM | POA: Diagnosis not present

## 2020-01-10 DIAGNOSIS — N1831 Chronic kidney disease, stage 3a: Secondary | ICD-10-CM | POA: Diagnosis not present

## 2020-01-10 DIAGNOSIS — K219 Gastro-esophageal reflux disease without esophagitis: Secondary | ICD-10-CM | POA: Diagnosis not present

## 2020-01-12 DIAGNOSIS — Z20828 Contact with and (suspected) exposure to other viral communicable diseases: Secondary | ICD-10-CM | POA: Diagnosis not present

## 2020-01-16 DIAGNOSIS — Z6831 Body mass index (BMI) 31.0-31.9, adult: Secondary | ICD-10-CM | POA: Diagnosis not present

## 2020-01-16 DIAGNOSIS — M109 Gout, unspecified: Secondary | ICD-10-CM | POA: Diagnosis not present

## 2020-01-16 DIAGNOSIS — M24522 Contracture, left elbow: Secondary | ICD-10-CM | POA: Diagnosis not present

## 2020-01-16 DIAGNOSIS — M24521 Contracture, right elbow: Secondary | ICD-10-CM | POA: Diagnosis not present

## 2020-01-16 DIAGNOSIS — I878 Other specified disorders of veins: Secondary | ICD-10-CM | POA: Diagnosis not present

## 2020-01-16 DIAGNOSIS — R600 Localized edema: Secondary | ICD-10-CM | POA: Diagnosis not present

## 2020-01-16 DIAGNOSIS — Z86718 Personal history of other venous thrombosis and embolism: Secondary | ICD-10-CM | POA: Diagnosis not present

## 2020-01-16 DIAGNOSIS — E669 Obesity, unspecified: Secondary | ICD-10-CM | POA: Diagnosis not present

## 2020-01-17 DIAGNOSIS — R7309 Other abnormal glucose: Secondary | ICD-10-CM | POA: Diagnosis not present

## 2020-01-17 DIAGNOSIS — E782 Mixed hyperlipidemia: Secondary | ICD-10-CM | POA: Diagnosis not present

## 2020-01-17 DIAGNOSIS — R7303 Prediabetes: Secondary | ICD-10-CM | POA: Diagnosis not present

## 2020-01-17 DIAGNOSIS — M10079 Idiopathic gout, unspecified ankle and foot: Secondary | ICD-10-CM | POA: Diagnosis not present

## 2020-01-17 DIAGNOSIS — E559 Vitamin D deficiency, unspecified: Secondary | ICD-10-CM | POA: Diagnosis not present

## 2020-02-26 IMAGING — US US EXTREM LOW VENOUS
1 series · 13 of 24 positions shown · non-contrast
Comparison: None.

CLINICAL DATA: Chronic left leg swelling for several years with new
onset of right leg swelling femur 3 weeks.



[Series 1: us extrem low venous · 0.07mm/px · 13 of 60 slices shown]
[im 1/60]
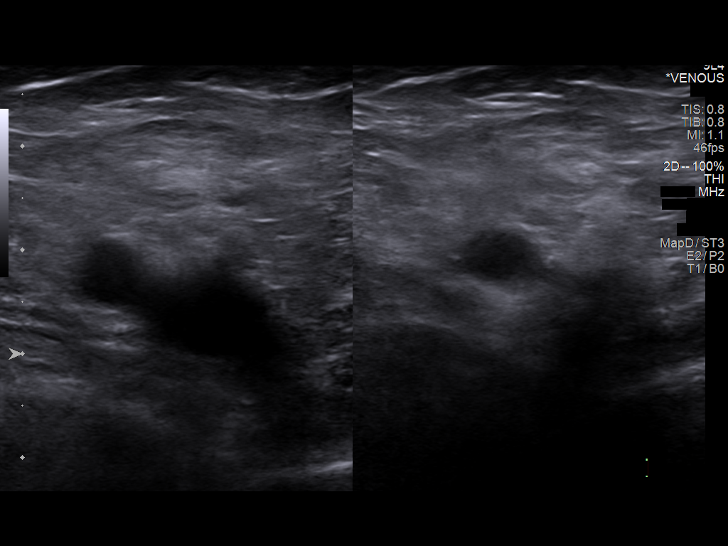
[im 6/60]
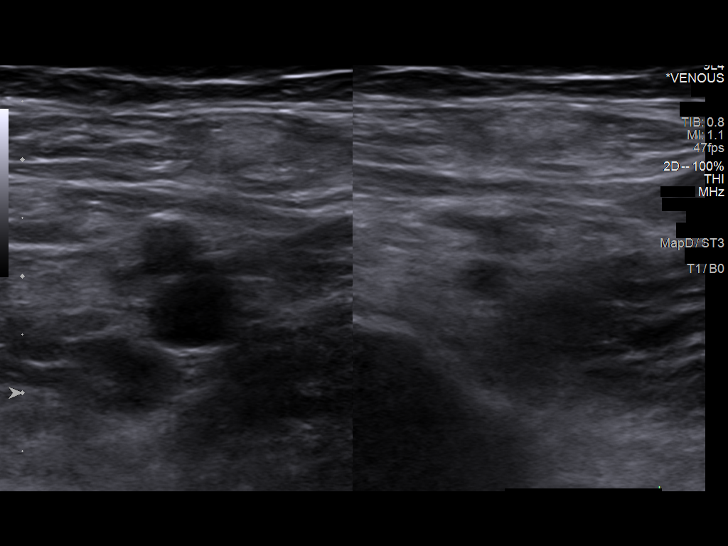
[im 11/60]
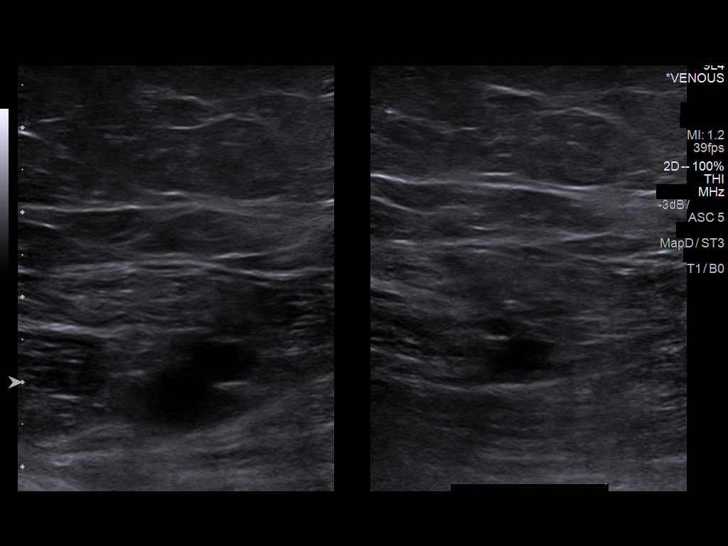
[im 16/60]
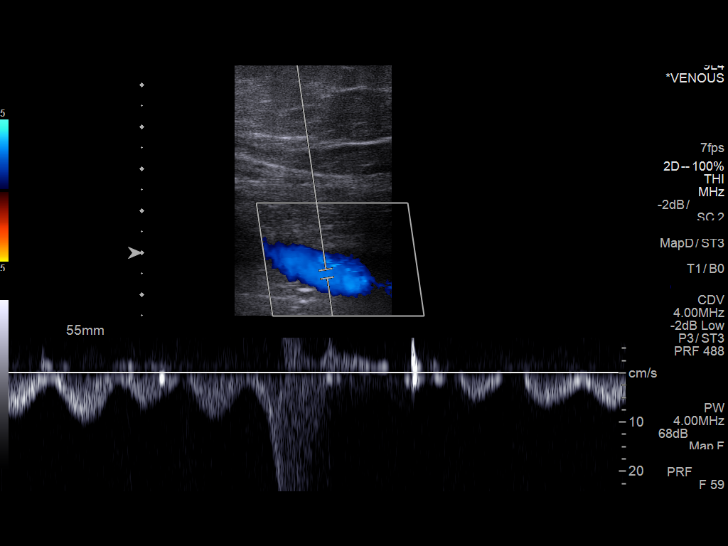
[im 21/60]
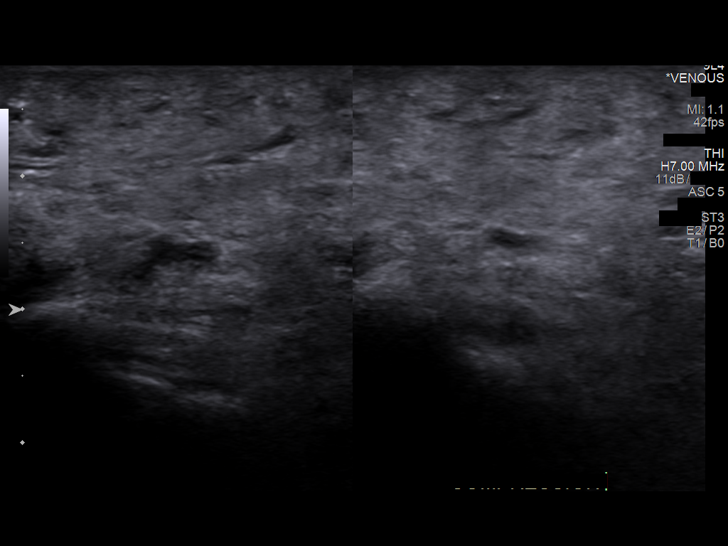
[im 26/60]
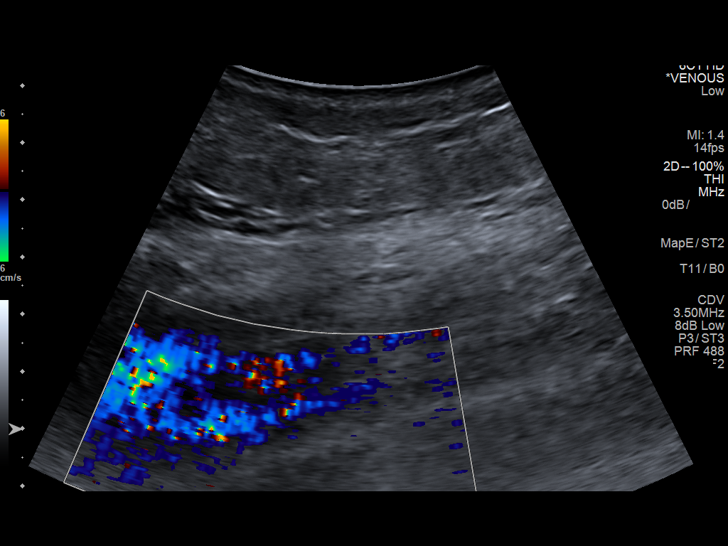
[im 31/60]
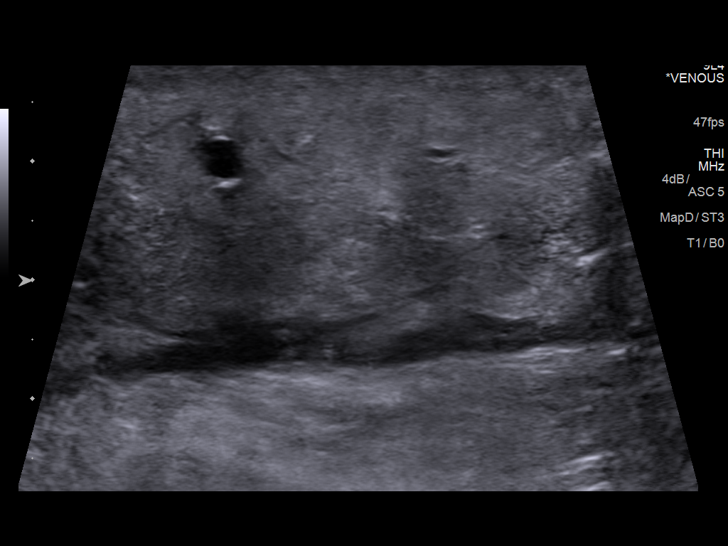
[im 34/60]
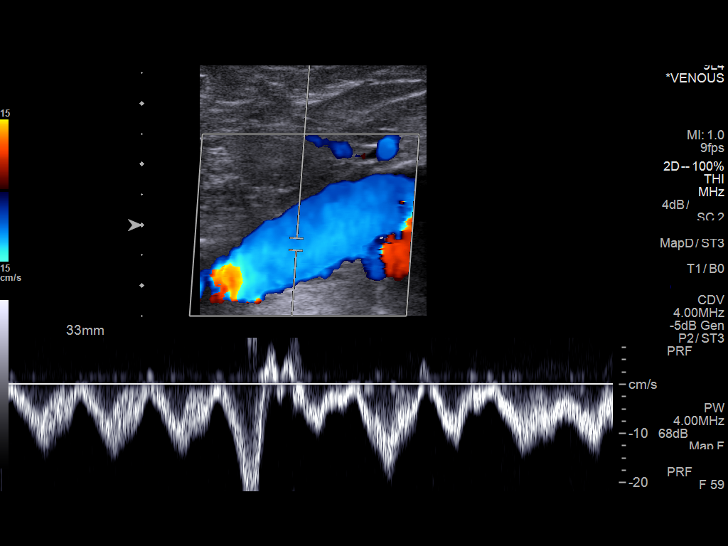
[im 39/60]
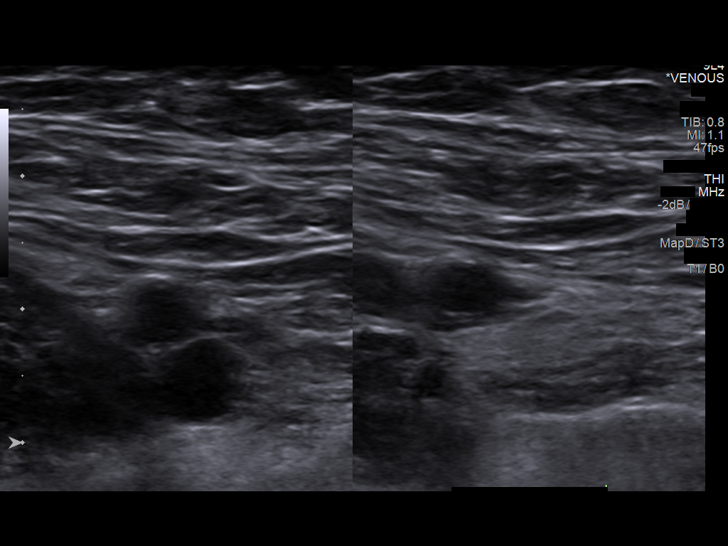
[im 44/60]
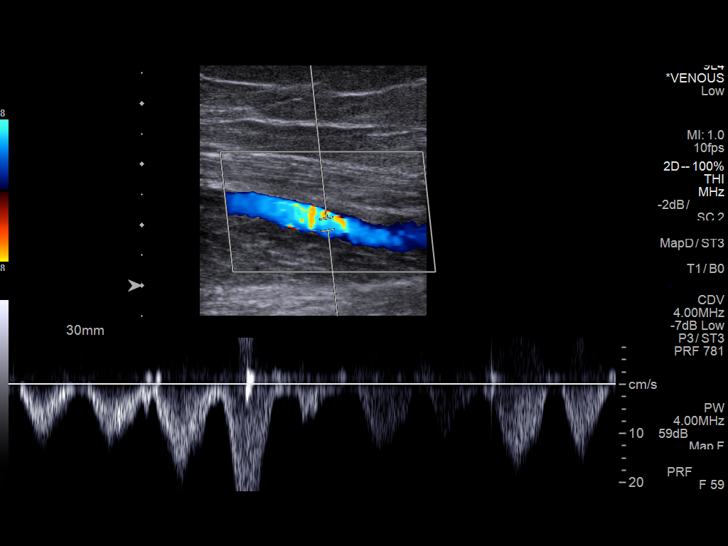
[im 49/60]
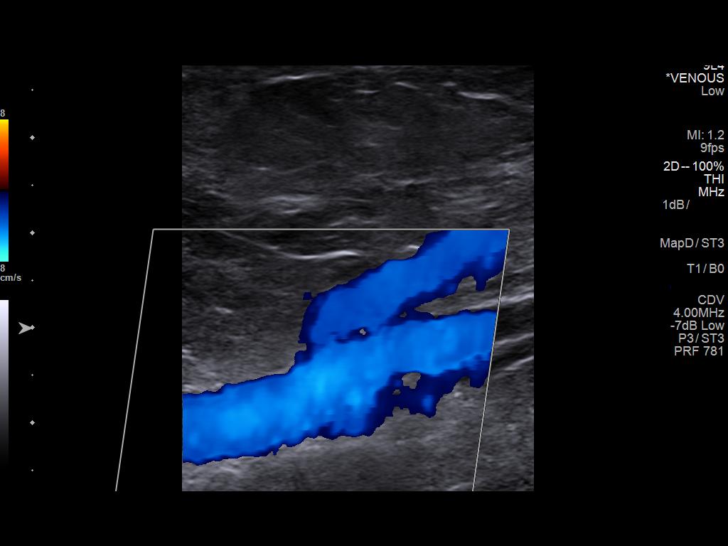
[im 54/60]
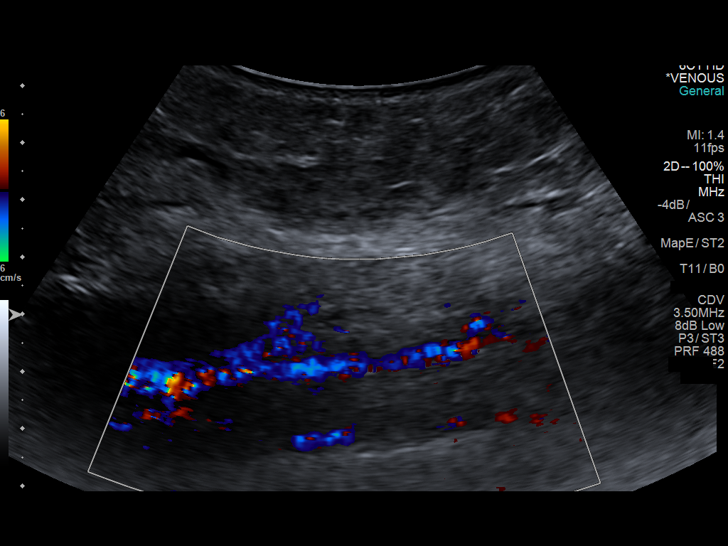
[im 60/60]
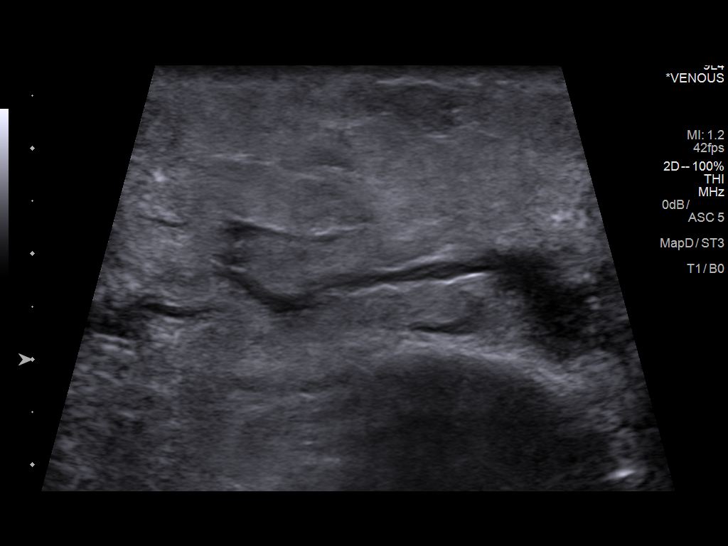

[13 of 24 positions shown; findings below may reference images not displayed]

FINDINGS: RIGHT LOWER EXTREMITY

Common Femoral Vein: No evidence of thrombus. Normal
compressibility, respiratory phasicity and response to augmentation.

Saphenofemoral Junction: No evidence of thrombus. Normal
compressibility and flow on color Doppler imaging.

Profunda Femoral Vein: No evidence of thrombus. Normal
compressibility and flow on color Doppler imaging.

Femoral Vein: No evidence of thrombus. Normal compressibility,
respiratory phasicity and response to augmentation.

Popliteal Vein: No evidence of thrombus. Normal compressibility,
respiratory phasicity and response to augmentation.

Calf Veins: Calf veins are not well visualized due to superficial
edema.

Superficial Great Saphenous Vein: No evidence of thrombus. Normal
compressibility.

Venous Reflux:  None.

Other Findings:  None.

LEFT LOWER EXTREMITY

Common Femoral Vein: No evidence of thrombus. Normal
compressibility, respiratory phasicity and response to augmentation.

Saphenofemoral Junction: No evidence of thrombus. Normal
compressibility and flow on color Doppler imaging.

Profunda Femoral Vein: No evidence of thrombus. Normal
compressibility and flow on color Doppler imaging.

Femoral Vein: No evidence of thrombus. Normal compressibility,
respiratory phasicity and response to augmentation.

Popliteal Vein: No evidence of thrombus. Normal compressibility,
respiratory phasicity and response to augmentation.

Calf Veins: Calf veins are not well visualized due to superficial
edema.

Superficial Great Saphenous Vein: No evidence of thrombus. Normal
compressibility.

Venous Reflux:  None.

Other Findings:  None.
IMPRESSION: No evidence of deep venous thrombosis bilaterally. The calf veins
are not well visualized.

Lower extremity edema is noted bilaterally.

## 2020-04-06 DIAGNOSIS — Z0100 Encounter for examination of eyes and vision without abnormal findings: Secondary | ICD-10-CM | POA: Diagnosis not present

## 2020-04-06 DIAGNOSIS — H524 Presbyopia: Secondary | ICD-10-CM | POA: Diagnosis not present

## 2020-04-09 DIAGNOSIS — M1990 Unspecified osteoarthritis, unspecified site: Secondary | ICD-10-CM | POA: Diagnosis not present

## 2020-04-09 DIAGNOSIS — N1831 Chronic kidney disease, stage 3a: Secondary | ICD-10-CM | POA: Diagnosis not present

## 2020-04-09 DIAGNOSIS — E782 Mixed hyperlipidemia: Secondary | ICD-10-CM | POA: Diagnosis not present

## 2020-04-09 DIAGNOSIS — I10 Essential (primary) hypertension: Secondary | ICD-10-CM | POA: Diagnosis not present

## 2020-04-09 DIAGNOSIS — K219 Gastro-esophageal reflux disease without esophagitis: Secondary | ICD-10-CM | POA: Diagnosis not present

## 2020-06-07 DIAGNOSIS — N1831 Chronic kidney disease, stage 3a: Secondary | ICD-10-CM | POA: Diagnosis not present

## 2020-06-07 DIAGNOSIS — N183 Chronic kidney disease, stage 3 unspecified: Secondary | ICD-10-CM | POA: Diagnosis not present

## 2020-06-07 DIAGNOSIS — K219 Gastro-esophageal reflux disease without esophagitis: Secondary | ICD-10-CM | POA: Diagnosis not present

## 2020-06-07 DIAGNOSIS — M1990 Unspecified osteoarthritis, unspecified site: Secondary | ICD-10-CM | POA: Diagnosis not present

## 2020-06-07 DIAGNOSIS — I10 Essential (primary) hypertension: Secondary | ICD-10-CM | POA: Diagnosis not present

## 2020-06-07 DIAGNOSIS — E782 Mixed hyperlipidemia: Secondary | ICD-10-CM | POA: Diagnosis not present

## 2020-07-19 ENCOUNTER — Other Ambulatory Visit: Payer: Self-pay | Admitting: Family Medicine

## 2020-07-19 DIAGNOSIS — E782 Mixed hyperlipidemia: Secondary | ICD-10-CM | POA: Diagnosis not present

## 2020-07-19 DIAGNOSIS — Z0000 Encounter for general adult medical examination without abnormal findings: Secondary | ICD-10-CM | POA: Diagnosis not present

## 2020-07-19 DIAGNOSIS — M1990 Unspecified osteoarthritis, unspecified site: Secondary | ICD-10-CM | POA: Diagnosis not present

## 2020-07-19 DIAGNOSIS — E559 Vitamin D deficiency, unspecified: Secondary | ICD-10-CM | POA: Diagnosis not present

## 2020-07-19 DIAGNOSIS — R7303 Prediabetes: Secondary | ICD-10-CM | POA: Diagnosis not present

## 2020-07-19 DIAGNOSIS — Z1389 Encounter for screening for other disorder: Secondary | ICD-10-CM | POA: Diagnosis not present

## 2020-07-19 DIAGNOSIS — I10 Essential (primary) hypertension: Secondary | ICD-10-CM | POA: Diagnosis not present

## 2020-07-19 DIAGNOSIS — M85832 Other specified disorders of bone density and structure, left forearm: Secondary | ICD-10-CM

## 2020-07-19 DIAGNOSIS — M10079 Idiopathic gout, unspecified ankle and foot: Secondary | ICD-10-CM | POA: Diagnosis not present

## 2020-07-19 DIAGNOSIS — N1831 Chronic kidney disease, stage 3a: Secondary | ICD-10-CM | POA: Diagnosis not present

## 2020-08-08 DIAGNOSIS — N1831 Chronic kidney disease, stage 3a: Secondary | ICD-10-CM | POA: Diagnosis not present

## 2020-08-08 DIAGNOSIS — I10 Essential (primary) hypertension: Secondary | ICD-10-CM | POA: Diagnosis not present

## 2020-08-08 DIAGNOSIS — N183 Chronic kidney disease, stage 3 unspecified: Secondary | ICD-10-CM | POA: Diagnosis not present

## 2020-08-08 DIAGNOSIS — E782 Mixed hyperlipidemia: Secondary | ICD-10-CM | POA: Diagnosis not present

## 2020-08-08 DIAGNOSIS — K219 Gastro-esophageal reflux disease without esophagitis: Secondary | ICD-10-CM | POA: Diagnosis not present

## 2020-08-08 DIAGNOSIS — C50919 Malignant neoplasm of unspecified site of unspecified female breast: Secondary | ICD-10-CM | POA: Diagnosis not present

## 2020-08-08 DIAGNOSIS — Z853 Personal history of malignant neoplasm of breast: Secondary | ICD-10-CM | POA: Diagnosis not present

## 2020-08-08 DIAGNOSIS — M1990 Unspecified osteoarthritis, unspecified site: Secondary | ICD-10-CM | POA: Diagnosis not present

## 2020-09-11 ENCOUNTER — Encounter: Payer: Self-pay | Admitting: Cardiovascular Disease

## 2020-09-11 NOTE — Progress Notes (Signed)
Sarah Williamson Date of Birth  [birth year]       [facility]      [id number], [place]    [place], [place]  [address]    [phone]       Fax  [phone]       Problem List: 1. Hypertension 2. 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 History breast cancer 4. Spinal stenosis 5. Pulmonary embolus- treated with Xarelto     Sarah Williamson is doing well,  We  decreased her HCTZ and she  is doing well.  She still does not get much exercise.  Oct. 14, 2014:  Sarah Williamson is doing better.  She had right rotator cuff surgery and has been in rehab for most of the summer.   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    No dyspnea.    She takes her bp at home and typically get 130/ 82.    OCt. 1, 2015:  Sarah Williamson  was hospitalized in May for a pulmonary embolus. She was started on Xarelto.  She saw [name] after the hospitalization. She did not have any significant ortho problems prior to her PE.   She had been taken off her ASA ( due to gout problems)   It appears that this was an unprovoked PE.   Oct. 3, 2016:  Sarah Williamson is doing well BP has been a bit high. She had an unprovoked pulmonary embolis .  Took xarelto for 6 months.   Was managed by oncology / hemotology .   May 14, 2015:  BP is a bit high today . Was elevated this am here in the office.  No CP or dyspnea.   May 29, 2016:  Doing well.  Not exercising as much as she should  No CP or dyspnea   June 08, 2017 :  Sarah Williamson is seen today for follow up of her HTN Has been recording her BP -  Most readings are a little elevated.  Still eating more salt than she should  Sausage biscuits in the am   September 10, 2017:  Sarah Williamson is seen today for follow up of her HTN We tried HCTZ but she developed gout BP readings at home look very good.  No cp , no dyspnea.  Is avoiding salt   October 10, 2019: Case seen back today after 2-year in person absence.   We had a video visit last year. Still  short of breath .  She admits that her weight is playing a role in this  Is not getting any regular exercise due to back issues Admits that she is deconditioned.  BP is elevated.     Eats bacon and sausage twice a week  We have tried HCTZ in the past but she developed worsening creatinine .  Has been on prednisone for gout . Is already on allopurinol.  Is not on colchicine   Aug. 3, 2022:  Sarah Williamson is seen today for follow up of her HTN, obesity,  hx of pulmonary embolus Eats salt on occasion   BP has been a little elevated.  Is not exercising    Current Outpatient Medications on File Prior to Visit  Medication Sig Dispense Refill   acetaminophen (TYLENOL) 500 MG tablet Take 1,000 mg by mouth every 4 (four) hours as needed for mild pain or headache.      allopurinol (ZYLOPRIM) 100 MG tablet Take 200 mg by mouth 2 (  two) times daily.     aspirin EC 81 MG tablet Take 81 mg by mouth daily.     CALCIUM CITRATE-VITAMIN D PO Take 1 tablet by mouth daily.     carvedilol (COREG) 25 MG tablet Take 1 tablet (25 mg total) by mouth 2 (two) times daily. 180 tablet 3   Cholecalciferol (VITAMIN D) 50 MCG (2000 UT) CAPS Take 1 capsule by mouth daily.     cycloSPORINE (RESTASIS OP) Place 1 drop into both eyes 2 (two) times daily.     famotidine (PEPCID) 10 MG tablet Take 10 mg by mouth as needed for heartburn or indigestion.     losartan (COZAAR) 100 MG tablet Take 1 tablet (100 mg total) by mouth daily. 90 tablet 2   Magnesium 400 MG TABS Take 1 tablet by mouth daily.     Turmeric 500 MG CAPS Take 1 capsule by mouth daily.     predniSONE (DELTASONE) 5 MG tablet Take 5 mg by mouth as directed. 12 days only (Patient not taking: Reported on 09/12/2020)     traMADol (ULTRAM) 50 MG tablet Take 50 mg by mouth every 6 (six) hours as needed (pain).  (Patient not taking: Reported on 09/12/2020)     No current facility-administered medications on file prior to visit.    Allergies  Allergen Reactions   Lisinopril  Cough   Penicillins Other (See Comments)    Occurred as a child; unknown reaction   Sulfa Antibiotics    Allopurinol Rash    Past Medical History:  Diagnosis Date   Arthritis    Asymmetric septal hypertrophy (HCC)    WITH MILD LVOT OBSTRUCTION   Cancer (HCC)    Chest pain, unspecified    Heart murmur    sees nasher   History of blood transfusion    due to chemo   History of breast cancer    Hot flashes    Hypertension    Personal history of PE (pulmonary embolism) 2015   PONV (postoperative nausea and vomiting)    Renal insufficiency    TRANSIENT- PROBABLY RESOLVED   Shortness of breath dyspnea    exertion    Past Surgical History:  Procedure Laterality Date   BTL     AND 2 MISCARRIAGES; rotator cuff last june   EYE SURGERY Bilateral    cataracts   MASTECTOMY Bilateral    7 year ago; lymph nodes removed right side   ROTATOR CUFF REPAIR Right    TOTAL KNEE ARTHROPLASTY Right 01/02/2014   Procedure: RIGHT TOTAL KNEE ARTHROPLASTY;  Surgeon: Vickey Huger, MD;  Location: [facility];  Service: Orthopedics;  Laterality: Right;   TUBAL LIGATION      Social History   Tobacco Use  Smoking Status Never  Smokeless Tobacco Never    Social History   Substance and Sexual Activity  Alcohol Use No    Family History  Problem Relation Age of Onset   Hypertension Mother    Heart failure Father    Heart attack Father    Diabetes Maternal Grandmother    Diabetes Maternal Grandfather    Heart attack Paternal Grandmother     Reviw of Systems:  Noted in current history, otherwise negative.  Physical Exam: Blood pressure 140/78, pulse 74, height '4\' 8"'$  (1.422 m), weight 150 lb (68 kg), SpO2 97 %.  GEN:  Well nourished, well developed in no acute distress HEENT: Normal NECK: No JVD; No carotid bruits LYMPHATICS: No lymphadenopathy CARDIAC: RRR , soft systolic murmur  RESPIRATORY:  Clear to auscultation without rales, wheezing or rhonchi  ABDOMEN: Soft, non-tender,  non-distended MUSCULOSKELETAL:  2 + leg edema  SKIN: Warm and dry NEUROLOGIC:  Alert and oriented x 3   ECG: September 12, 2020: Normal sinus rhythm at 74.  No ST or T wave changes.   Assessment / Plan:   1. Hypertension -       BP is a bit elevated. Add HCTZ 25 mg QD, Kdur 10 mew QD BMP in 3 weeks. Watch for worsening renal function and gout To see an APP in 6 months   2. 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 History breast cancer 4. Spinal stenosis  5. Pulmonary embolus-   No recurrent PE symptoms   6.  Leg edema:      [name], MD  09/12/2020 10:29 AM    [facility] [place],  [place], [place]  [address] Pager [phone] Phone: [phone]; Fax: [phone]

## 2020-09-12 ENCOUNTER — Encounter: Payer: Self-pay | Admitting: Cardiovascular Disease

## 2020-09-12 ENCOUNTER — Other Ambulatory Visit: Payer: Self-pay

## 2020-09-12 ENCOUNTER — Ambulatory Visit: Payer: Medicare HMO | Admitting: Cardiovascular Disease

## 2020-09-12 VITALS — BP 140/78 | HR 74 | Ht <= 58 in | Wt 150.0 lb

## 2020-09-12 DIAGNOSIS — I422 Other hypertrophic cardiomyopathy: Secondary | ICD-10-CM

## 2020-09-12 DIAGNOSIS — I10 Essential (primary) hypertension: Secondary | ICD-10-CM | POA: Diagnosis not present

## 2020-09-12 DIAGNOSIS — Q248 Other specified congenital malformations of heart: Secondary | ICD-10-CM | POA: Diagnosis not present

## 2020-09-12 MED ORDER — HYDROCHLOROTHIAZIDE 25 MG PO TABS: 25.0000 mg | ORAL_TABLET | Freq: Every day | ORAL | 3 refills | Status: DC

## 2020-09-12 MED ORDER — POTASSIUM CHLORIDE ER 10 MEQ PO TBCR: 10.0000 meq | EXTENDED_RELEASE_TABLET | Freq: Every day | ORAL | 3 refills | Status: DC

## 2020-09-12 NOTE — Patient Instructions (Addendum)
Medication Instructions:  Your physician has recommended you make the following change in your medication:  START HCTZ (Hydrochlorothiazide) 25 mg once daily in the morning START Kdur (potassium chloride) 10 mEq once daily in the morning *If you need a refill on your cardiac medications before your next appointment, please call your pharmacy*   Lab Work: Your physician recommends that you return for lab work in: 3 weeks on Wed. Aug. 24. You may come in anytime between 7:30 am and 4:45 pm   If you have labs (blood work) drawn today and your tests are completely normal, you will receive your results only by: [place] (if you have MyChart) OR A paper copy in the mail If you have any lab test that is abnormal or we need to change your treatment, we will call you to review the results.   Testing/Procedures: None Ordered   Follow-Up: At [facility], you and your health needs are our priority.  As part of our continuing mission to provide you with exceptional heart care, we have created designated Provider Care Teams.  These Care Teams include your primary Cardiologist (physician) and Advanced Practice Providers (APPs -  Physician Assistants and Nurse Practitioners) who all work together to provide you with the care you need, when you need it.   Your next appointment:   6 month(s)  The format for your next appointment:   In Person  Provider:   You will see one of the following Advanced Practice Providers on your designated Care Team:   Richardson Dopp, PA-C [place], [place]    Other Instructions  For your  leg edema you  should do  the following 1. Leg elevation - I recommend the Lounge Dr. Leg rest.  See below for details  2. Salt restriction  -  Use potassium chloride instead of regular salt as a salt substitute. 3. Walk regularly 4. Compression hose - [facility] 5. Weight loss    Available on [email or web address] Or  Go to [email or web address]

## 2020-09-20 ENCOUNTER — Other Ambulatory Visit: Payer: Self-pay | Admitting: Cardiovascular Disease

## 2020-09-20 DIAGNOSIS — M109 Gout, unspecified: Secondary | ICD-10-CM | POA: Diagnosis not present

## 2020-09-20 DIAGNOSIS — Z79899 Other long term (current) drug therapy: Secondary | ICD-10-CM | POA: Diagnosis not present

## 2020-09-26 DIAGNOSIS — I10 Essential (primary) hypertension: Secondary | ICD-10-CM | POA: Diagnosis not present

## 2020-09-26 DIAGNOSIS — M1990 Unspecified osteoarthritis, unspecified site: Secondary | ICD-10-CM | POA: Diagnosis not present

## 2020-09-26 DIAGNOSIS — K219 Gastro-esophageal reflux disease without esophagitis: Secondary | ICD-10-CM | POA: Diagnosis not present

## 2020-09-26 DIAGNOSIS — N183 Chronic kidney disease, stage 3 unspecified: Secondary | ICD-10-CM | POA: Diagnosis not present

## 2020-09-26 DIAGNOSIS — E782 Mixed hyperlipidemia: Secondary | ICD-10-CM | POA: Diagnosis not present

## 2020-10-03 ENCOUNTER — Other Ambulatory Visit: Payer: Self-pay

## 2020-10-03 ENCOUNTER — Other Ambulatory Visit: Payer: Medicare HMO | Admitting: *Deleted

## 2020-10-03 DIAGNOSIS — I10 Essential (primary) hypertension: Secondary | ICD-10-CM

## 2020-10-03 DIAGNOSIS — Q248 Other specified congenital malformations of heart: Secondary | ICD-10-CM

## 2020-10-03 LAB — BASIC METABOLIC PANEL
BUN/Creatinine Ratio: 28 (ref 12–28)
BUN: 38 mg/dL — ABNORMAL HIGH (ref 8–27)
CO2: 24 mmol/L (ref 20–29)
Calcium: 9.8 mg/dL (ref 8.7–10.3)
Chloride: 105 mmol/L (ref 96–106)
Creatinine, Ser: 1.36 mg/dL — ABNORMAL HIGH (ref 0.57–1.00)
Glucose: 115 mg/dL — ABNORMAL HIGH (ref 65–99)
Potassium: 5.1 mmol/L (ref 3.5–5.2)
Sodium: 143 mmol/L (ref 134–144)
eGFR: 40 mL/min/{1.73_m2} — ABNORMAL LOW (ref >59)

## 2020-10-26 ENCOUNTER — Other Ambulatory Visit: Payer: Self-pay | Admitting: Cardiovascular Disease

## 2020-10-31 DIAGNOSIS — Z23 Encounter for immunization: Secondary | ICD-10-CM | POA: Diagnosis not present

## 2020-11-14 DIAGNOSIS — U071 COVID-19: Secondary | ICD-10-CM | POA: Diagnosis not present

## 2020-12-07 ENCOUNTER — Other Ambulatory Visit: Payer: Self-pay | Admitting: Cardiovascular Disease

## 2021-01-21 ENCOUNTER — Ambulatory Visit
Admission: RE | Admit: 2021-01-21 | Discharge: 2021-01-21 | Disposition: A | Discharge status: Home / Self Care | Payer: Medicare HMO | Source: Ambulatory Visit | Attending: Family Medicine | Admitting: Family Medicine

## 2021-01-21 ENCOUNTER — Other Ambulatory Visit: Payer: Self-pay

## 2021-01-21 DIAGNOSIS — M85832 Other specified disorders of bone density and structure, left forearm: Secondary | ICD-10-CM

## 2021-01-21 DIAGNOSIS — Z780 Asymptomatic menopausal state: Secondary | ICD-10-CM | POA: Diagnosis not present

## 2021-01-22 DIAGNOSIS — E782 Mixed hyperlipidemia: Secondary | ICD-10-CM | POA: Diagnosis not present

## 2021-01-22 DIAGNOSIS — E559 Vitamin D deficiency, unspecified: Secondary | ICD-10-CM | POA: Diagnosis not present

## 2021-01-22 DIAGNOSIS — K219 Gastro-esophageal reflux disease without esophagitis: Secondary | ICD-10-CM | POA: Diagnosis not present

## 2021-01-22 DIAGNOSIS — I10 Essential (primary) hypertension: Secondary | ICD-10-CM | POA: Diagnosis not present

## 2021-01-22 DIAGNOSIS — M85832 Other specified disorders of bone density and structure, left forearm: Secondary | ICD-10-CM | POA: Diagnosis not present

## 2021-01-22 DIAGNOSIS — R7303 Prediabetes: Secondary | ICD-10-CM | POA: Diagnosis not present

## 2021-01-22 DIAGNOSIS — M1990 Unspecified osteoarthritis, unspecified site: Secondary | ICD-10-CM | POA: Diagnosis not present

## 2021-01-22 DIAGNOSIS — N1831 Chronic kidney disease, stage 3a: Secondary | ICD-10-CM | POA: Diagnosis not present

## 2021-01-22 DIAGNOSIS — M10079 Idiopathic gout, unspecified ankle and foot: Secondary | ICD-10-CM | POA: Diagnosis not present

## 2021-02-27 DIAGNOSIS — N1831 Chronic kidney disease, stage 3a: Secondary | ICD-10-CM | POA: Diagnosis not present

## 2021-03-07 ENCOUNTER — Other Ambulatory Visit: Payer: Self-pay | Admitting: Cardiovascular Disease

## 2021-03-12 DIAGNOSIS — M2550 Pain in unspecified joint: Secondary | ICD-10-CM | POA: Diagnosis not present

## 2021-03-12 DIAGNOSIS — Z6830 Body mass index (BMI) 30.0-30.9, adult: Secondary | ICD-10-CM | POA: Diagnosis not present

## 2021-03-12 DIAGNOSIS — Z79899 Other long term (current) drug therapy: Secondary | ICD-10-CM | POA: Diagnosis not present

## 2021-03-12 DIAGNOSIS — E669 Obesity, unspecified: Secondary | ICD-10-CM | POA: Diagnosis not present

## 2021-03-12 DIAGNOSIS — M1009 Idiopathic gout, multiple sites: Secondary | ICD-10-CM | POA: Diagnosis not present

## 2021-03-27 ENCOUNTER — Encounter: Payer: Self-pay | Admitting: Physician Assistant

## 2021-03-27 ENCOUNTER — Other Ambulatory Visit: Payer: Self-pay

## 2021-03-27 ENCOUNTER — Ambulatory Visit: Payer: Medicare HMO | Admitting: Physician Assistant

## 2021-03-27 VITALS — BP 120/70 | HR 74 | Ht <= 58 in | Wt 147.2 lb

## 2021-03-27 DIAGNOSIS — I10 Essential (primary) hypertension: Secondary | ICD-10-CM

## 2021-03-27 DIAGNOSIS — R0609 Other forms of dyspnea: Secondary | ICD-10-CM | POA: Diagnosis not present

## 2021-03-27 DIAGNOSIS — R0602 Shortness of breath: Secondary | ICD-10-CM

## 2021-03-27 DIAGNOSIS — Z853 Personal history of malignant neoplasm of breast: Secondary | ICD-10-CM | POA: Diagnosis not present

## 2021-03-27 DIAGNOSIS — I2782 Chronic pulmonary embolism: Secondary | ICD-10-CM

## 2021-03-27 NOTE — Progress Notes (Signed)
Office Visit    Patient Name: Sarah Williamson Date of Encounter: 03/27/2021  PCP:  Antony Contras, MD   [facility]  Cardiologist:  [name], MD  Advanced Practice Provider:  No care team member to display Electrophysiologist:  None   HPI    Sarah Williamson is a 81 y.o. female with a hx of hypertension, history of breast cancer, spinal stenosis, and pulmonary embolus treated with Xarelto presents today for 6 months follow-up.   She has been followed by Dr. Acie Fredrickson since October 2014. She was hospitalized in Oct 2015 for pulmonary embolus and was started on Xarelto. She was taken off of ASA due to gout.  She took xarelto for a total of 6 months. She was seen annually and overall was doing well. She was having some SOB back in August 2021. She was encouraged to limit bacon and sausage in her diet and start a routine exercise program. Weight loss was also discussed. When she was seen the following year Aug 2022 she was still on exercising. HCTZ was added.   Today, she shares that she gets short of breath when walking from the parking lot.  She also has shortness of breath with stairs and walking up an incline.  She does okay walking around her house.  Her legs stay swollen even on her HCTZ.  She tries to elevate them at night but does not wear any compression socks or hose because they are too tight and cut off her circulation.  Her weight is overall decreasing.  She has been trying to maintain a low-sodium diet but at times has not been successful.  She has tried taking Lasix in the past for her volume overload but her kidney numbers increased so she was taken off of it.  Reports no shortness of breath nor dyspnea on exertion. Reports no chest pain, pressure, or tightness. No edema, orthopnea, PND. Reports no palpitations.    Past Medical History    Past Medical History:  Diagnosis Date   Arthritis    Asymmetric septal hypertrophy (HCC)    WITH MILD LVOT  OBSTRUCTION   Cancer (HCC)    Chest pain, unspecified    Heart murmur    sees nasher   History of blood transfusion    due to chemo   History of breast cancer    Hot flashes    Hypertension    Personal history of PE (pulmonary embolism) 2015   PONV (postoperative nausea and vomiting)    Renal insufficiency    TRANSIENT- PROBABLY RESOLVED   Shortness of breath dyspnea    exertion   Past Surgical History:  Procedure Laterality Date   BTL     AND 2 MISCARRIAGES; rotator cuff last june   EYE SURGERY Bilateral    cataracts   MASTECTOMY Bilateral    7 year ago; lymph nodes removed right side   ROTATOR CUFF REPAIR Right    TOTAL KNEE ARTHROPLASTY Right 01/02/2014   Procedure: RIGHT TOTAL KNEE ARTHROPLASTY;  Surgeon: Vickey Huger, MD;  Location: [facility];  Service: Orthopedics;  Laterality: Right;   TUBAL LIGATION      Allergies  Allergies  Allergen Reactions   Lisinopril Cough   Penicillins Other (See Comments)    Occurred as a child; unknown reaction   Sulfa Antibiotics    Allopurinol Rash     EKGs/Labs/Other Studies Reviewed:   The following studies were reviewed today:  CT Angio 03/07/2018  IMPRESSION: No  evidence of pulmonary emboli.   Mild bibasilar atelectasis without sizable effusion.  EKG:  EKG is not ordered today.   Recent Labs: 10/03/2020: BUN 38; Creatinine, Ser 1.36; Potassium 5.1; Sodium 143  Recent Lipid Panel    Component Value Date/Time   CHOL 143 11/23/2012 1025   TRIG 100.0 11/23/2012 1025   HDL 56.10 11/23/2012 1025   CHOLHDL 3 11/23/2012 1025   VLDL 20.0 11/23/2012 1025   LDLCALC 67 11/23/2012 1025    Home Medications   Current Meds  Medication Sig   acetaminophen (TYLENOL) 500 MG tablet Take 1,000 mg by mouth every 4 (four) hours as needed for mild pain or headache.    allopurinol (ZYLOPRIM) 100 MG tablet Take 200 mg by mouth 2 (two) times daily.   aspirin EC 81 MG tablet Take 81 mg by mouth daily.   CALCIUM CITRATE-VITAMIN D PO  Take 1 tablet by mouth daily.   carvedilol (COREG) 25 MG tablet TAKE 1 TABLET TWICE DAILY   Cholecalciferol (VITAMIN D) 50 MCG (2000 UT) CAPS Take 1 capsule by mouth daily.   cycloSPORINE (RESTASIS OP) Place 1 drop into both eyes 2 (two) times daily.   famotidine (PEPCID) 10 MG tablet Take 10 mg by mouth as needed for heartburn or indigestion.   hydrochlorothiazide (HYDRODIURIL) 25 MG tablet Take 1 tablet (25 mg total) by mouth daily.   losartan (COZAAR) 100 MG tablet Take 1 tablet by mouth once daily   Magnesium 400 MG TABS Take 1 tablet by mouth daily.   potassium chloride (KLOR-CON) 10 MEQ tablet Take 1 tablet (10 mEq total) by mouth daily.   Turmeric 500 MG CAPS Take 1 capsule by mouth daily.     Review of Systems      All other systems reviewed and are otherwise negative except as noted above.  Physical Exam    VS:  BP 120/70 (BP Location: Left Arm, Patient Position: Sitting, Cuff Size: Large)    Pulse 74    Ht 4\' 7"  (1.397 m)    Wt 147 lb 3.2 oz (66.8 kg)    SpO2 97%    BMI 34.21 kg/m  , BMI Body mass index is 34.21 kg/m.  Wt Readings from Last 3 Encounters:  03/27/21 147 lb 3.2 oz (66.8 kg)  09/12/20 150 lb (68 kg)  10/10/19 154 lb 12.8 oz (70.2 kg)     GEN: Well nourished, well developed, in no acute distress. HEENT: normal. Neck: Supple, no JVD, carotid bruits, or masses. Cardiac: RRR, no murmurs, rubs, or gallops. No clubbing, cyanosis, edema.  Radials/PT 2+ and equal bilaterally.  Respiratory:  Respirations regular and unlabored, clear to auscultation bilaterally. GI: Soft, nontender, nondistended. MS: No deformity or atrophy. Skin: Warm and dry, no rash. Neuro:  Strength and sensation are intact. Psych: Normal affect.  Assessment & Plan    Hypertension -Blood pressure is well controlled today 120/70 -Continue Cozaar 100 mg, Coreg 25 mg -She is compliant with these medicines and tolerating well  2. SOB  -This has been a chronic issue over the last year + and  has been about the same -She does not have a history of COPD or smoking -She has dealt with chronic fluid overload with lower extremity 2+ pitting edema -Would suggest elastic therapy for compression -Maintain a low-sodium diet less than 2 g daily -We ordered an echocardiogram today since her last one was in 2015 to evaluate her heart function and valves -We also ordered a BNP -Low threshold for  starting Lasix therapy based on her creatinine to get her euvolemic and then initiation of Farxiga/Jardiance  3. 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 Hx of breast cancer  5. Pulmonary embolus -not on a blood thinner, was discontinued a while ago -Just takes asa 81 mg    Disposition: Follow up 6 months with [name], MD or APP.  Signed, Elgie Collard, PA-C 03/27/2021, 4:09 PM [facility]

## 2021-03-27 NOTE — Patient Instructions (Addendum)
Medication Instructions:   Your physician recommends that you continue on your current medications as directed. Please refer to the Current Medication list given to you today.   *If you need a refill on your cardiac medications before your next appointment, please call your pharmacy*   Lab Work:  TODAY!!!!  PRO BNP  If you have labs (blood work) drawn today and your tests are completely normal, you will receive your results only by: [place] (if you have MyChart) OR A paper copy in the mail If you have any lab test that is abnormal or we need to change your treatment, we will call you to review the results.   Testing/Procedures:  Your physician has requested that you have an echocardiogram. Echocardiography is a painless test that uses sound waves to create images of your heart. It provides your doctor with information about the size and shape of your heart and how well your hearts chambers and valves are working. This procedure takes approximately one hour. There are no restrictions for this procedure.    Follow-Up: At [facility], you and your health needs are our priority.  As part of our continuing mission to provide you with exceptional heart care, we have created designated Provider Care Teams.  These Care Teams include your primary Cardiologist (physician) and Advanced Practice Providers (APPs -  Physician Assistants and Nurse Practitioners) who all work together to provide you with the care you need, when you need it.  We recommend signing up for the patient portal called "MyChart".  Sign up information is provided on this After Visit Summary.  MyChart is used to connect with patients for Virtual Visits (Telemedicine).  Patients are able to view lab/test results, encounter notes, upcoming appointments, etc.  Non-urgent messages can be sent to your provider as well.   To learn more about what you can do with MyChart, go to [email or web address].    Your next  appointment:   6 month(s)  The format for your next appointment:   In Person  Provider:   [name], MD     Other Instructions   Elastic Therapy (Compression Hose). [place], [place] [address]  [place] Diet [place] refers to food and lifestyle choices that are based on the traditions of countries located on the [employer]. It focuses on eating more fruits, vegetables, whole grains, beans, nuts, seeds, and heart-healthy fats, and eating less dairy, meat, eggs, and processed foods with added sugar, salt, and fat. This way of eating has been shown to help prevent certain conditions and improve outcomes for people who have chronic diseases, like kidney disease and heart disease. What are tips for following this plan? Reading food labels Check the serving size of packaged foods. For foods such as rice and pasta, the serving size refers to the amount of cooked product, not dry. Check the total fat in packaged foods. Avoid foods that have saturated fat or trans fats. Check the ingredient list for added sugars, such as corn syrup. Shopping  Buy a variety of foods that offer a balanced diet, including: Fresh fruits and vegetables (produce). Grains, beans, nuts, and seeds. Some of these may be available in unpackaged forms or large amounts (in bulk). Fresh seafood. Poultry and eggs. Low-fat dairy products. Buy whole ingredients instead of prepackaged foods. Buy fresh fruits and vegetables in-season from local farmers markets. Buy plain frozen fruits and vegetables. If you do not have access to quality fresh seafood, buy precooked frozen shrimp  or canned fish, such as tuna, salmon, or sardines. Stock your pantry so you always have certain foods on hand, such as olive oil, canned tuna, canned tomatoes, rice, pasta, and beans. Cooking Cook foods with extra-virgin olive oil instead of using butter or other vegetable oils. Have meat as a side dish,  and have vegetables or grains as your main dish. This means having meat in small portions or adding small amounts of meat to foods like pasta or stew. Use beans or vegetables instead of meat in common dishes like chili or lasagna. Experiment with different cooking methods. Try roasting, broiling, steaming, and sauting vegetables. Add frozen vegetables to soups, stews, pasta, or rice. Add nuts or seeds for added healthy fats and plant protein at each meal. You can add these to yogurt, salads, or vegetable dishes. Marinate fish or vegetables using olive oil, lemon juice, garlic, and fresh herbs. Meal planning Plan to eat one vegetarian meal one day each week. Try to work up to two vegetarian meals, if possible. Eat seafood two or more times a week. Have healthy snacks readily available, such as: Vegetable sticks with hummus. Greek yogurt. Fruit and nut trail mix. Eat balanced meals throughout the week. This includes: Fruit: 2-3 servings a day. Vegetables: 4-5 servings a day. Low-fat dairy: 2 servings a day. Fish, poultry, or lean meat: 1 serving a day. Beans and legumes: 2 or more servings a week. Nuts and seeds: 1-2 servings a day. Whole grains: 6-8 servings a day. Extra-virgin olive oil: 3-4 servings a day. Limit red meat and sweets to only a few servings a month. Lifestyle  Cook and eat meals together with your family, when possible. Drink enough fluid to keep your urine pale yellow. Be physically active every day. This includes: Aerobic exercise like running or swimming. Leisure activities like gardening, walking, or housework. Get 7-8 hours of sleep each night. If recommended by your health care provider, drink red wine in moderation. This means 1 glass a day for nonpregnant women and 2 glasses a day for men. A glass of wine equals 5 oz (150 mL). What foods should I eat? Fruits Apples. Apricots. Avocado. Berries. Bananas. Cherries. Dates. Figs. Grapes. Lemons. Melon. Oranges.  Peaches. Plums. Pomegranate. Vegetables Artichokes. Beets. Broccoli. Cabbage. Carrots. Eggplant. Green beans. Chard. Kale. Spinach. Onions. Leeks. Peas. Squash. Tomatoes. Peppers. Radishes. Grains Whole-grain pasta. Brown rice. Bulgur wheat. Polenta. Couscous. Whole-wheat bread. [place]. Meats and other proteins Beans. Almonds. Sunflower seeds. Pine nuts. Peanuts. [place]. Salmon. Scallops. Shrimp. [place]. Tilapia. Clams. Oysters. Eggs. Poultry without skin. Dairy Low-fat milk. Cheese. Greek yogurt. Fats and oils Extra-virgin olive oil. Avocado oil. Grapeseed oil. Beverages Water. Red wine. Herbal tea. Sweets and desserts Greek yogurt with honey. Baked apples. Poached pears. Trail mix. Seasonings and condiments Basil. Cilantro. Coriander. Cumin. Mint. Parsley. Sage. Rosemary. Tarragon. Garlic. Oregano. Thyme. Pepper. Balsamic vinegar. Tahini. Hummus. Tomato sauce. Olives. Mushrooms. The items listed above may not be a complete list of foods and beverages you can eat. Contact a dietitian for more information. What foods should I limit? This is a list of foods that should be eaten rarely or only on special occasions. Fruits Fruit canned in syrup. Vegetables Deep-fried potatoes (french fries). Grains Prepackaged pasta or rice dishes. Prepackaged cereal with added sugar. Prepackaged snacks with added sugar. Meats and other proteins Beef. Pork. Lamb. Poultry with skin. Hot dogs. [place] Salines. Dairy Ice cream. Sour cream. Whole milk. Fats and oils Butter. Canola oil. Vegetable oil. Beef fat (tallow). Lard. Beverages  Juice. Sugar-sweetened soft drinks. Beer. Liquor and spirits. Sweets and desserts Cookies. Cakes. Pies. Candy. Seasonings and condiments Mayonnaise. Pre-made sauces and marinades. The items listed above may not be a complete list of foods and beverages you should limit. Contact a dietitian for more information. Summary The Mediterranean diet includes both food and lifestyle  choices. Eat a variety of fresh fruits and vegetables, beans, nuts, seeds, and whole grains. Limit the amount of red meat and sweets that you eat. If recommended by your health care provider, drink red wine in moderation. This means 1 glass a day for nonpregnant women and 2 glasses a day for men. A glass of wine equals 5 oz (150 mL). This information is not intended to replace advice given to you by your health care provider. Make sure you discuss any questions you have with your health care provider. Document Revised: 03/04/2019 Document Reviewed: 12/30/2018 Elsevier Patient Education  2022 [employer].

## 2021-03-28 LAB — PRO B NATRIURETIC PEPTIDE: NT-Pro BNP: 981 pg/mL — ABNORMAL HIGH (ref 0–738)

## 2021-04-03 ENCOUNTER — Other Ambulatory Visit: Payer: Self-pay

## 2021-04-03 ENCOUNTER — Ambulatory Visit (HOSPITAL_COMMUNITY): Payer: Medicare HMO | Attending: Cardiology

## 2021-04-03 DIAGNOSIS — I2782 Chronic pulmonary embolism: Secondary | ICD-10-CM | POA: Diagnosis not present

## 2021-04-03 DIAGNOSIS — I10 Essential (primary) hypertension: Secondary | ICD-10-CM | POA: Insufficient documentation

## 2021-04-03 DIAGNOSIS — Z853 Personal history of malignant neoplasm of breast: Secondary | ICD-10-CM | POA: Diagnosis not present

## 2021-04-03 DIAGNOSIS — R0609 Other forms of dyspnea: Secondary | ICD-10-CM | POA: Insufficient documentation

## 2021-04-03 LAB — ECHOCARDIOGRAM COMPLETE
Area-P 1/2: 2.59 cm2
P 1/2 time: 519 msec
S' Lateral: 2.3 cm

## 2021-04-24 DIAGNOSIS — Z0100 Encounter for examination of eyes and vision without abnormal findings: Secondary | ICD-10-CM | POA: Diagnosis not present

## 2021-04-24 DIAGNOSIS — H524 Presbyopia: Secondary | ICD-10-CM | POA: Diagnosis not present

## 2021-06-03 ENCOUNTER — Other Ambulatory Visit: Payer: Self-pay | Admitting: Cardiovascular Disease

## 2021-08-14 ENCOUNTER — Other Ambulatory Visit: Payer: Self-pay | Admitting: Cardiovascular Disease

## 2021-09-02 ENCOUNTER — Other Ambulatory Visit: Payer: Self-pay

## 2021-09-02 MED ORDER — POTASSIUM CHLORIDE ER 10 MEQ PO TBCR: 10.0000 meq | EXTENDED_RELEASE_TABLET | Freq: Every day | ORAL | 3 refills | Status: DC

## 2021-09-02 MED ORDER — HYDROCHLOROTHIAZIDE 25 MG PO TABS: 25.0000 mg | ORAL_TABLET | Freq: Every day | ORAL | 3 refills | Status: DC

## 2021-09-10 DIAGNOSIS — R7303 Prediabetes: Secondary | ICD-10-CM | POA: Diagnosis not present

## 2021-09-10 DIAGNOSIS — N1831 Chronic kidney disease, stage 3a: Secondary | ICD-10-CM | POA: Diagnosis not present

## 2021-09-10 DIAGNOSIS — K219 Gastro-esophageal reflux disease without esophagitis: Secondary | ICD-10-CM | POA: Diagnosis not present

## 2021-09-10 DIAGNOSIS — M10079 Idiopathic gout, unspecified ankle and foot: Secondary | ICD-10-CM | POA: Diagnosis not present

## 2021-09-10 DIAGNOSIS — M85832 Other specified disorders of bone density and structure, left forearm: Secondary | ICD-10-CM | POA: Diagnosis not present

## 2021-09-10 DIAGNOSIS — Z0000 Encounter for general adult medical examination without abnormal findings: Secondary | ICD-10-CM | POA: Diagnosis not present

## 2021-09-10 DIAGNOSIS — I10 Essential (primary) hypertension: Secondary | ICD-10-CM | POA: Diagnosis not present

## 2021-09-10 DIAGNOSIS — M1990 Unspecified osteoarthritis, unspecified site: Secondary | ICD-10-CM | POA: Diagnosis not present

## 2021-09-10 DIAGNOSIS — E559 Vitamin D deficiency, unspecified: Secondary | ICD-10-CM | POA: Diagnosis not present

## 2021-09-10 DIAGNOSIS — E782 Mixed hyperlipidemia: Secondary | ICD-10-CM | POA: Diagnosis not present

## 2021-09-27 ENCOUNTER — Ambulatory Visit: Payer: Medicare HMO | Admitting: Cardiovascular Disease

## 2021-10-01 NOTE — Progress Notes (Unsigned)
Sarah Williamson Date of Birth  03/30/1940       [facility]      [address], [place]    [place], [place]  [address]    [phone]       Fax  [phone]       Problem List: 1. Hypertension 2. 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 History breast cancer 4. Spinal stenosis 5. Pulmonary embolus- treated with Xarelto     Sarah Williamson is doing well,  We  decreased her HCTZ and she  is doing well.  She still does not get much exercise.  Oct. 14, 2014:  Sarah Williamson is doing better.  She had right rotator cuff surgery and has been in rehab for most of the summer.   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    No dyspnea.    She takes her bp at home and typically get 130/ 82.    OCt. 1, 2015:  Sarah Williamson  was hospitalized in May for a pulmonary embolus. She was started on Xarelto.  She saw [name] after the hospitalization. She did not have any significant ortho problems prior to her PE.   She had been taken off her ASA ( due to gout problems)   It appears that this was an unprovoked PE.   Oct. 3, 2016:  Sarah Williamson is doing well BP has been a bit high. She had an unprovoked pulmonary embolis .  Took xarelto for 6 months.   Was managed by oncology / hemotology .   May 14, 2015:  BP is a bit high today . Was elevated this am here in the office.  No CP or dyspnea.   May 29, 2016:  Doing well.  Not exercising as much as she should  No CP or dyspnea   June 08, 2017 :  Sarah Williamson is seen today for follow up of her HTN Has been recording her BP -  Most readings are a little elevated.  Still eating more salt than she should  Sausage biscuits in the am   September 10, 2017:  Sarah Williamson is seen today for follow up of her HTN We tried HCTZ but she developed gout BP readings at home look very good.  No cp , no dyspnea.  Is avoiding salt   October 10, 2019: Case seen back today after 2-year in person absence.   We had a video visit last year. Still  short of breath .  She admits that her weight is playing a role in this  Is not getting any regular exercise due to back issues Admits that she is deconditioned.  BP is elevated.     Eats bacon and sausage twice a week  We have tried HCTZ in the past but she developed worsening creatinine .  Has been on prednisone for gout . Is already on allopurinol.  Is not on colchicine   Aug. 3, 2022:  Sarah Williamson is seen today for follow up of her HTN, obesity,  hx of pulmonary embolus Eats salt on occasion   BP has been a little elevated.  Is not exercising   Aug. 23, 2023 Sarah Williamson is seen for follow up of her HTN, obesity, hx of pulmonary embolus  Her husband passed away this past year  No CP , has chronic dyspnea   Current Outpatient Medications on File Prior to Visit  Medication Sig Dispense Refill   acetaminophen (TYLENOL) 500 MG  tablet Take 1,000 mg by mouth every 4 (four) hours as needed for mild pain or headache.      allopurinol (ZYLOPRIM) 100 MG tablet Take 200 mg by mouth 2 (two) times daily.     aspirin EC 81 MG tablet Take 81 mg by mouth daily.     CALCIUM CITRATE-VITAMIN D PO Take 1 tablet by mouth daily.     carvedilol (COREG) 25 MG tablet TAKE 1 TABLET TWICE DAILY 180 tablet 3   Cholecalciferol (VITAMIN D) 50 MCG (2000 UT) CAPS Take 1 capsule by mouth daily.     cycloSPORINE (RESTASIS OP) Place 1 drop into both eyes 2 (two) times daily.     famotidine (PEPCID) 10 MG tablet Take 10 mg by mouth as needed for heartburn or indigestion.     hydrochlorothiazide (HYDRODIURIL) 25 MG tablet Take 1 tablet (25 mg total) by mouth daily. 90 tablet 3   losartan (COZAAR) 100 MG tablet Take 1 tablet by mouth once daily 90 tablet 2   Magnesium 400 MG TABS Take 1 tablet by mouth daily.     potassium chloride (KLOR-CON) 10 MEQ tablet Take 1 tablet (10 mEq total) by mouth daily. 90 tablet 3   RESTASIS 0.05 % ophthalmic emulsion 1 drop 2 (two) times daily.     Turmeric 500 MG CAPS Take 1 capsule by mouth  daily.     No current facility-administered medications on file prior to visit.    Allergies  Allergen Reactions   Colchicine     Other reaction(s): Symptoms worsened   Lisinopril Cough   Penicillins Other (See Comments)    Occurred as a child; unknown reaction   Sulfa Antibiotics    Zoster Vac Recomb Adjuvanted     Other reaction(s): arm pain/itching/localized rash   Allopurinol Rash    Past Medical History:  Diagnosis Date   Arthritis    Asymmetric septal hypertrophy (HCC)    WITH MILD LVOT OBSTRUCTION   Cancer (HCC)    Chest pain, unspecified    Heart murmur    sees nasher   History of blood transfusion    due to chemo   History of breast cancer    Hot flashes    Hypertension    Personal history of PE (pulmonary embolism) 2015   PONV (postoperative nausea and vomiting)    Renal insufficiency    TRANSIENT- PROBABLY RESOLVED   Shortness of breath dyspnea    exertion    Past Surgical History:  Procedure Laterality Date   BTL     AND 2 MISCARRIAGES; rotator cuff last june   EYE SURGERY Bilateral    cataracts   MASTECTOMY Bilateral    7 year ago; lymph nodes removed right side   ROTATOR CUFF REPAIR Right    TOTAL KNEE ARTHROPLASTY Right 01/02/2014   Procedure: RIGHT TOTAL KNEE ARTHROPLASTY;  Surgeon: Vickey Huger, MD;  Location: [facility];  Service: Orthopedics;  Laterality: Right;   TUBAL LIGATION      Social History   Tobacco Use  Smoking Status Never  Smokeless Tobacco Never    Social History   Substance and Sexual Activity  Alcohol Use No    Family History  Problem Relation Age of Onset   Hypertension Mother    Heart failure Father    Heart attack Father    Diabetes Maternal Grandmother    Diabetes Maternal Grandfather    Heart attack Paternal Grandmother     Reviw of Systems:  Noted in current history,  otherwise negative.  Physical Exam: Blood pressure 128/72, pulse 63, height '4\' 7"'$  (1.397 m), weight 141 lb (64 kg), SpO2 97 %.        GEN:  Well nourished, well developed in no acute distress HEENT: Normal NECK: No JVD; No carotid bruits LYMPHATICS: No lymphadenopathy CARDIAC: RRR  soft systolic murmur  RESPIRATORY:  Clear to auscultation without rales, wheezing or rhonchi  ABDOMEN: Soft, non-tender, non-distended MUSCULOSKELETAL: bilat leg edema ,  L>R  ; No deformity  SKIN: Warm and dry NEUROLOGIC:  Alert and oriented x 3   ECG:   aug. 23, 2023   Sinus rhythm with 1st degree block .   Otherwise normal      Assessment / Plan:   1. Hypertension -      BP is well controlled , cont current meds.    2. 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  Well controlled    3. History breast cancer  4. Spinal stenosis  5. Pulmonary embolus-   Hx of DVT in the past .  Legs remain swollen.  No acute DVt.  Angelia Mould has chronic venous insufficiency    6.  Leg edema:      [name], MD  10/02/2021 9:27 AM    [address],  [place], [place]  [address] Pager [phone] Phone: [phone]; Fax: [phone]

## 2021-10-02 ENCOUNTER — Encounter: Payer: Self-pay | Admitting: Cardiovascular Disease

## 2021-10-02 ENCOUNTER — Ambulatory Visit: Payer: Medicare HMO | Admitting: Cardiovascular Disease

## 2021-10-02 VITALS — BP 128/72 | HR 63 | Ht <= 58 in | Wt 141.0 lb

## 2021-10-02 DIAGNOSIS — I2782 Chronic pulmonary embolism: Secondary | ICD-10-CM

## 2021-10-02 DIAGNOSIS — I10 Essential (primary) hypertension: Secondary | ICD-10-CM

## 2021-10-02 MED ORDER — CARVEDILOL 25 MG PO TABS: 25.0000 mg | ORAL_TABLET | Freq: Two times a day (BID) | ORAL | 3 refills | Status: DC

## 2021-10-02 NOTE — Patient Instructions (Signed)
Medication Instructions:  ° °Your physician recommends that you continue on your current medications as directed. Please refer to the Current Medication list given to you today. ° °*If you need a refill on your cardiac medications before your next appointment, please call your pharmacy* ° ° ° °Follow-Up: °At [facility], you and your health needs are our priority.  As part of our continuing mission to provide you with exceptional heart care, we have created designated Provider Care Teams.  These Care Teams include your primary Cardiologist (physician) and Advanced Practice Providers (APPs -  Physician Assistants and Nurse Practitioners) who all work together to provide you with the care you need, when you need it. ° °We recommend signing up for the patient portal called "MyChart".  Sign up information is provided on this After Visit Summary.  MyChart is used to connect with patients for Virtual Visits (Telemedicine).  Patients are able to view lab/test results, encounter notes, upcoming appointments, etc.  Non-urgent messages can be sent to your provider as well.   °To learn more about what you can do with MyChart, go to [email or web address].   ° °Your next appointment:   °1 year(s) ° °The format for your next appointment:   °In Person ° °Provider:   °Philip Nahser, MD { ° ° °

## 2022-03-17 DIAGNOSIS — E782 Mixed hyperlipidemia: Secondary | ICD-10-CM | POA: Diagnosis not present

## 2022-03-17 DIAGNOSIS — M10079 Idiopathic gout, unspecified ankle and foot: Secondary | ICD-10-CM | POA: Diagnosis not present

## 2022-03-17 DIAGNOSIS — Z86718 Personal history of other venous thrombosis and embolism: Secondary | ICD-10-CM | POA: Diagnosis not present

## 2022-03-17 DIAGNOSIS — N1831 Chronic kidney disease, stage 3a: Secondary | ICD-10-CM | POA: Diagnosis not present

## 2022-03-17 DIAGNOSIS — M25552 Pain in left hip: Secondary | ICD-10-CM | POA: Diagnosis not present

## 2022-03-17 DIAGNOSIS — E559 Vitamin D deficiency, unspecified: Secondary | ICD-10-CM | POA: Diagnosis not present

## 2022-03-17 DIAGNOSIS — M85832 Other specified disorders of bone density and structure, left forearm: Secondary | ICD-10-CM | POA: Diagnosis not present

## 2022-03-17 DIAGNOSIS — R7303 Prediabetes: Secondary | ICD-10-CM | POA: Diagnosis not present

## 2022-03-17 DIAGNOSIS — I10 Essential (primary) hypertension: Secondary | ICD-10-CM | POA: Diagnosis not present

## 2022-03-17 DIAGNOSIS — Z853 Personal history of malignant neoplasm of breast: Secondary | ICD-10-CM | POA: Diagnosis not present

## 2022-03-17 DIAGNOSIS — R252 Cramp and spasm: Secondary | ICD-10-CM | POA: Diagnosis not present

## 2022-03-17 DIAGNOSIS — M1990 Unspecified osteoarthritis, unspecified site: Secondary | ICD-10-CM | POA: Diagnosis not present

## 2022-05-15 ENCOUNTER — Other Ambulatory Visit: Payer: Self-pay | Admitting: Cardiovascular Disease

## 2022-09-10 ENCOUNTER — Other Ambulatory Visit: Payer: Self-pay | Admitting: Cardiovascular Disease

## 2022-09-28 ENCOUNTER — Encounter: Payer: Self-pay | Admitting: Cardiovascular Disease

## 2022-09-28 NOTE — Progress Notes (Unsigned)
Sarah Williamson Date of Birth  09-18-1940       [facility]      [address]    [place], [place]  [address]    [phone]       Fax  [phone]       Problem List: 1. Hypertension 2. 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 History breast cancer 4. Spinal stenosis 5. Pulmonary embolus- treated with Xarelto     Sarah Williamson is doing well,  We  decreased her HCTZ and she  is doing well.  She still does not get much exercise.  Oct. 14, 2014:  Sarah Williamson is doing better.  She had right rotator cuff surgery and has been in rehab for most of the summer.   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    No dyspnea.    She takes her bp at home and typically get 130/ 82.    OCt. 1, 2015:  Sarah Williamson  was hospitalized in May for a pulmonary embolus. She was started on Xarelto.  She saw Jacki Cones after the hospitalization. She did not have any significant ortho problems prior to her PE.   She had been taken off her ASA ( due to gout problems)   It appears that this was an unprovoked PE.   Oct. 3, 2016:  Sarah Williamson is doing well BP has been a bit high. She had an unprovoked pulmonary embolis .  Took xarelto for 6 months.   Was managed by oncology / hemotology .   May 14, 2015:  BP is a bit high today . Was elevated this am here in the office.  No CP or dyspnea.   May 29, 2016:  Doing well.  Not exercising as much as she should  No CP or dyspnea   June 08, 2017 :  Sarah Williamson is seen today for follow up of her HTN Has been recording her BP -  Most readings are a little elevated.  Still eating more salt than she should  Sausage biscuits in the am   September 10, 2017:  Sarah Williamson is seen today for follow up of her HTN We tried HCTZ but she developed gout BP readings at home look very good.  No cp , no dyspnea.  Is avoiding salt   October 10, 2019: Case seen back today after 2-year in person absence.   We had a video visit last year. Still  short of breath .  She admits that her weight is playing a role in this  Is not getting any regular exercise due to back issues Admits that she is deconditioned.  BP is elevated.     Eats bacon and sausage twice a week  We have tried HCTZ in the past but she developed worsening creatinine .  Has been on prednisone for gout . Is already on allopurinol.  Is not on colchicine   Aug. 3, 2022:  Sarah Williamson is seen today for follow up of her HTN, obesity,  hx of pulmonary embolus Eats salt on occasion   BP has been a little elevated.  Is not exercising   Aug. 23, 2023 Sarah Williamson is seen for follow up of her HTN, obesity, hx of pulmonary embolus  Her husband passed away this past year  No CP , has chronic dyspnea    Aug. 19, 2024 Sarah Williamson is seen for follow up of her HTN, obesity, hx of pulmonary embolus  Feeling well  BP is a bit elevated   No CP , no dyspnea   Has some leg swelling  Has tried compression hose - was not able to tolerate  Hx of pulmonary embolus years ago . Is not on any anticoagulation      Current Outpatient Medications on File Prior to Visit  Medication Sig Dispense Refill   acetaminophen (TYLENOL) 500 MG tablet Take 1,000 mg by mouth every 4 (four) hours as needed for mild pain or headache.      allopurinol (ZYLOPRIM) 100 MG tablet Take 200 mg by mouth 2 (two) times daily.     aspirin EC 81 MG tablet Take 81 mg by mouth daily.     CALCIUM CITRATE-VITAMIN D PO Take 1 tablet by mouth daily.     carvedilol (COREG) 25 MG tablet Take 1 tablet (25 mg total) by mouth 2 (two) times daily. 180 tablet 3   Cholecalciferol (VITAMIN D) 50 MCG (2000 UT) CAPS Take 1 capsule by mouth daily.     cycloSPORINE (RESTASIS OP) Place 1 drop into both eyes 2 (two) times daily.     famotidine (PEPCID) 10 MG tablet Take 10 mg by mouth as needed for heartburn or indigestion.     hydrochlorothiazide (HYDRODIURIL) 25 MG tablet Take 1 tablet by mouth once daily 90 tablet 3   losartan (COZAAR) 100 MG  tablet Take 1 tablet by mouth once daily 90 tablet 3   Magnesium 400 MG TABS Take 1 tablet by mouth daily.     potassium chloride (KLOR-CON) 10 MEQ tablet Take 1 tablet by mouth once daily 90 tablet 3   RESTASIS 0.05 % ophthalmic emulsion 1 drop 2 (two) times daily.     Turmeric 500 MG CAPS Take 1 capsule by mouth daily.     No current facility-administered medications on file prior to visit.    Allergies  Allergen Reactions   Colchicine     Other reaction(s): Symptoms worsened   Lisinopril Cough   Penicillins Other (See Comments)    Occurred as a child; unknown reaction   Sulfa Antibiotics    Zoster Vac Recomb Adjuvanted     Other reaction(s): arm pain/itching/localized rash   Allopurinol Rash    Past Medical History:  Diagnosis Date   Arthritis    Asymmetric septal hypertrophy (HCC)    WITH MILD LVOT OBSTRUCTION   Cancer (HCC)    Chest pain, unspecified    Heart murmur    sees nasher   History of blood transfusion    due to chemo   History of breast cancer    Hot flashes    Hypertension    Personal history of PE (pulmonary embolism) 2015   PONV (postoperative nausea and vomiting)    Renal insufficiency    TRANSIENT- PROBABLY RESOLVED   Shortness of breath dyspnea    exertion    Past Surgical History:  Procedure Laterality Date   BTL     AND 2 MISCARRIAGES; rotator cuff last june   EYE SURGERY Bilateral    cataracts   MASTECTOMY Bilateral    7 year ago; lymph nodes removed right side   ROTATOR CUFF REPAIR Right    TOTAL KNEE ARTHROPLASTY Right 01/02/2014   Procedure: RIGHT TOTAL KNEE ARTHROPLASTY;  Surgeon: Dannielle Huh, MD;  Location: [facility];  Service: Orthopedics;  Laterality: Right;   TUBAL LIGATION      Social History   Tobacco Use  Smoking Status Never  Smokeless Tobacco Never  Social History   Substance and Sexual Activity  Alcohol Use No    Family History  Problem Relation Age of Onset   Hypertension Mother    Heart failure Father     Heart attack Father    Diabetes Maternal Grandmother    Diabetes Maternal Grandfather    Heart attack Paternal Grandmother     Reviw of Systems:  Noted in current history, otherwise negative.   Physical Exam: Blood pressure 128/64, pulse 64, height 4\' 7"  (1.397 m), weight 130 lb 12.8 oz (59.3 kg), SpO2 98%.       GEN:  Well nourished, well developed in no acute distress HEENT: Normal NECK: No JVD; No carotid bruits LYMPHATICS: No lymphadenopathy CARDIAC: RRR , no murmurs, rubs, gallops RESPIRATORY:  Clear to auscultation without rales, wheezing or rhonchi  ABDOMEN: Soft, non-tender, non-distended MUSCULOSKELETAL:  1-2+ piting edema bilaterally No deformity  SKIN: Warm and dry NEUROLOGIC:  Alert and oriented x 3     ECG:    EKG Interpretation Date/Time:  Tuesday September 30 2022 09:34:26 EDT Ventricular Rate:  64 PR Interval:  246 QRS Duration:  74 QT Interval:  420 QTC Calculation: 433 R Axis:   38  Text Interpretation: Sinus rhythm with 1st degree A-V block Cannot rule out Anterior infarct , age undetermined When compared with ECG of 07-Mar-2018 15:51, PR interval has increased Confirmed by Kristeen Miss ([id number]) on 09/30/2022 10:05:14 AM      Assessment / Plan:   1. Hypertension -       BP is fairly well controlled    2. 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 History breast cancer  4. Spinal stenosis  5. Pulmonary embolus-   years ago      6.  Leg edema:   chronic , persistent    Kristeen Miss, MD  09/30/2022 10:08 AM    [facility] [address] [place],  [address] [place], [place]  [address] Pager [phone] Phone: [phone]; Fax: [phone]

## 2022-09-30 ENCOUNTER — Encounter: Payer: Self-pay | Admitting: Cardiovascular Disease

## 2022-09-30 ENCOUNTER — Ambulatory Visit: Payer: HMO | Attending: Cardiovascular Disease | Admitting: Cardiovascular Disease

## 2022-09-30 VITALS — BP 128/64 | HR 64 | Ht <= 58 in | Wt 130.8 lb

## 2022-09-30 DIAGNOSIS — I10 Essential (primary) hypertension: Secondary | ICD-10-CM | POA: Diagnosis not present

## 2022-09-30 DIAGNOSIS — I2782 Chronic pulmonary embolism: Secondary | ICD-10-CM

## 2022-09-30 NOTE — Patient Instructions (Signed)
Medication Instructions:  Your physician recommends that you continue on your current medications as directed. Please refer to the Current Medication list given to you today.  *If you need a refill on your cardiac medications before your next appointment, please call your pharmacy*   Lab Work: BMET today If you have labs (blood work) drawn today and your tests are completely normal, you will receive your results only by: [place] (if you have MyChart) OR A paper copy in the mail If you have any lab test that is abnormal or we need to change your treatment, we will call you to review the results.   Testing/Procedures: NONE   Follow-Up: At [facility], you and your health needs are our priority.  As part of our continuing mission to provide you with exceptional heart care, we have created designated Provider Care Teams.  These Care Teams include your primary Cardiologist (physician) and Advanced Practice Providers (APPs -  Physician Assistants and Nurse Practitioners) who all work together to provide you with the care you need, when you need it.  We recommend signing up for the patient portal called "MyChart".  Sign up information is provided on this After Visit Summary.  MyChart is used to connect with patients for Virtual Visits (Telemedicine).  Patients are able to view lab/test results, encounter notes, upcoming appointments, etc.  Non-urgent messages can be sent to your provider as well.   To learn more about what you can do with MyChart, go to [email or web address].    Your next appointment:   1 year(s)  Provider:   [name], MD

## 2022-10-01 LAB — BASIC METABOLIC PANEL
BUN/Creatinine Ratio: 27 (ref 12–28)
BUN: 32 mg/dL — ABNORMAL HIGH (ref 8–27)
CO2: 22 mmol/L (ref 20–29)
Calcium: 9.4 mg/dL (ref 8.7–10.3)
Chloride: 104 mmol/L (ref 96–106)
Creatinine, Ser: 1.19 mg/dL — ABNORMAL HIGH (ref 0.57–1.00)
Glucose: 114 mg/dL — ABNORMAL HIGH (ref 70–99)
Potassium: 4.8 mmol/L (ref 3.5–5.2)
Sodium: 140 mmol/L (ref 134–144)
eGFR: 46 mL/min/{1.73_m2} — ABNORMAL LOW (ref >59)

## 2022-10-16 ENCOUNTER — Other Ambulatory Visit: Payer: Self-pay

## 2022-10-16 MED ORDER — CARVEDILOL 25 MG PO TABS: 25.0000 mg | ORAL_TABLET | Freq: Two times a day (BID) | ORAL | 3 refills | Status: DC

## 2022-10-20 DIAGNOSIS — J0100 Acute maxillary sinusitis, unspecified: Secondary | ICD-10-CM | POA: Diagnosis not present

## 2022-10-20 DIAGNOSIS — M62838 Other muscle spasm: Secondary | ICD-10-CM | POA: Diagnosis not present

## 2022-10-20 DIAGNOSIS — J40 Bronchitis, not specified as acute or chronic: Secondary | ICD-10-CM | POA: Diagnosis not present

## 2022-10-20 DIAGNOSIS — J069 Acute upper respiratory infection, unspecified: Secondary | ICD-10-CM | POA: Diagnosis not present

## 2022-10-24 DIAGNOSIS — I10 Essential (primary) hypertension: Secondary | ICD-10-CM | POA: Diagnosis not present

## 2022-10-24 DIAGNOSIS — M1990 Unspecified osteoarthritis, unspecified site: Secondary | ICD-10-CM | POA: Diagnosis not present

## 2022-10-24 DIAGNOSIS — J019 Acute sinusitis, unspecified: Secondary | ICD-10-CM | POA: Diagnosis not present

## 2022-10-24 DIAGNOSIS — M10079 Idiopathic gout, unspecified ankle and foot: Secondary | ICD-10-CM | POA: Diagnosis not present

## 2022-10-24 DIAGNOSIS — E782 Mixed hyperlipidemia: Secondary | ICD-10-CM | POA: Diagnosis not present

## 2022-10-24 DIAGNOSIS — E559 Vitamin D deficiency, unspecified: Secondary | ICD-10-CM | POA: Diagnosis not present

## 2022-10-24 DIAGNOSIS — H6122 Impacted cerumen, left ear: Secondary | ICD-10-CM | POA: Diagnosis not present

## 2022-10-24 DIAGNOSIS — N1831 Chronic kidney disease, stage 3a: Secondary | ICD-10-CM | POA: Diagnosis not present

## 2022-10-24 DIAGNOSIS — Z1331 Encounter for screening for depression: Secondary | ICD-10-CM | POA: Diagnosis not present

## 2022-10-24 DIAGNOSIS — Z0000 Encounter for general adult medical examination without abnormal findings: Secondary | ICD-10-CM | POA: Diagnosis not present

## 2022-10-24 DIAGNOSIS — R7303 Prediabetes: Secondary | ICD-10-CM | POA: Diagnosis not present

## 2022-10-24 DIAGNOSIS — R790 Abnormal level of blood mineral: Secondary | ICD-10-CM | POA: Diagnosis not present

## 2022-10-24 DIAGNOSIS — K219 Gastro-esophageal reflux disease without esophagitis: Secondary | ICD-10-CM | POA: Diagnosis not present

## 2022-11-21 DIAGNOSIS — D72829 Elevated white blood cell count, unspecified: Secondary | ICD-10-CM | POA: Diagnosis not present

## 2022-11-21 DIAGNOSIS — Z23 Encounter for immunization: Secondary | ICD-10-CM | POA: Diagnosis not present

## 2022-11-26 DIAGNOSIS — Z23 Encounter for immunization: Secondary | ICD-10-CM | POA: Diagnosis not present

## 2023-05-01 DIAGNOSIS — E782 Mixed hyperlipidemia: Secondary | ICD-10-CM | POA: Diagnosis not present

## 2023-05-01 DIAGNOSIS — Z853 Personal history of malignant neoplasm of breast: Secondary | ICD-10-CM | POA: Diagnosis not present

## 2023-05-01 DIAGNOSIS — N1831 Chronic kidney disease, stage 3a: Secondary | ICD-10-CM | POA: Diagnosis not present

## 2023-05-01 DIAGNOSIS — M85832 Other specified disorders of bone density and structure, left forearm: Secondary | ICD-10-CM | POA: Diagnosis not present

## 2023-05-01 DIAGNOSIS — R7303 Prediabetes: Secondary | ICD-10-CM | POA: Diagnosis not present

## 2023-05-01 DIAGNOSIS — I10 Essential (primary) hypertension: Secondary | ICD-10-CM | POA: Diagnosis not present

## 2023-05-01 DIAGNOSIS — M10079 Idiopathic gout, unspecified ankle and foot: Secondary | ICD-10-CM | POA: Diagnosis not present

## 2023-05-01 DIAGNOSIS — K219 Gastro-esophageal reflux disease without esophagitis: Secondary | ICD-10-CM | POA: Diagnosis not present

## 2023-05-01 DIAGNOSIS — J069 Acute upper respiratory infection, unspecified: Secondary | ICD-10-CM | POA: Diagnosis not present

## 2023-05-01 DIAGNOSIS — Z86718 Personal history of other venous thrombosis and embolism: Secondary | ICD-10-CM | POA: Diagnosis not present

## 2023-05-01 DIAGNOSIS — E559 Vitamin D deficiency, unspecified: Secondary | ICD-10-CM | POA: Diagnosis not present

## 2023-05-01 DIAGNOSIS — M1990 Unspecified osteoarthritis, unspecified site: Secondary | ICD-10-CM | POA: Diagnosis not present

## 2023-08-10 DIAGNOSIS — M1990 Unspecified osteoarthritis, unspecified site: Secondary | ICD-10-CM | POA: Diagnosis not present

## 2023-08-10 DIAGNOSIS — N1831 Chronic kidney disease, stage 3a: Secondary | ICD-10-CM | POA: Diagnosis not present

## 2023-08-10 DIAGNOSIS — J40 Bronchitis, not specified as acute or chronic: Secondary | ICD-10-CM | POA: Diagnosis not present

## 2023-08-10 DIAGNOSIS — I10 Essential (primary) hypertension: Secondary | ICD-10-CM | POA: Diagnosis not present

## 2023-08-11 DIAGNOSIS — N1831 Chronic kidney disease, stage 3a: Secondary | ICD-10-CM | POA: Diagnosis not present

## 2023-08-11 DIAGNOSIS — I10 Essential (primary) hypertension: Secondary | ICD-10-CM | POA: Diagnosis not present

## 2023-08-11 DIAGNOSIS — J40 Bronchitis, not specified as acute or chronic: Secondary | ICD-10-CM | POA: Diagnosis not present

## 2023-09-02 ENCOUNTER — Other Ambulatory Visit: Payer: Self-pay

## 2023-09-02 MED ORDER — LOSARTAN POTASSIUM 100 MG PO TABS: 100.0000 mg | ORAL_TABLET | Freq: Every day | ORAL | 0 refills | Status: DC

## 2023-09-02 MED ORDER — POTASSIUM CHLORIDE ER 10 MEQ PO TBCR: 10.0000 meq | EXTENDED_RELEASE_TABLET | Freq: Every day | ORAL | 0 refills | Status: DC

## 2023-09-02 MED ORDER — CARVEDILOL 25 MG PO TABS: 25.0000 mg | ORAL_TABLET | Freq: Two times a day (BID) | ORAL | 0 refills | Status: DC

## 2023-09-02 MED ORDER — HYDROCHLOROTHIAZIDE 25 MG PO TABS: 25.0000 mg | ORAL_TABLET | Freq: Every day | ORAL | 0 refills | Status: DC

## 2023-09-02 NOTE — Addendum Note (Signed)
 Addended by: BLUFORD RAMP D on: 09/02/2023 01:37 PM   Modules accepted: Orders

## 2023-09-10 DIAGNOSIS — N1831 Chronic kidney disease, stage 3a: Secondary | ICD-10-CM | POA: Diagnosis not present

## 2023-09-10 DIAGNOSIS — M1990 Unspecified osteoarthritis, unspecified site: Secondary | ICD-10-CM | POA: Diagnosis not present

## 2023-09-10 DIAGNOSIS — J40 Bronchitis, not specified as acute or chronic: Secondary | ICD-10-CM | POA: Diagnosis not present

## 2023-09-10 DIAGNOSIS — I10 Essential (primary) hypertension: Secondary | ICD-10-CM | POA: Diagnosis not present

## 2023-09-21 DIAGNOSIS — N1831 Chronic kidney disease, stage 3a: Secondary | ICD-10-CM | POA: Diagnosis not present

## 2023-09-21 DIAGNOSIS — J40 Bronchitis, not specified as acute or chronic: Secondary | ICD-10-CM | POA: Diagnosis not present

## 2023-09-21 DIAGNOSIS — I10 Essential (primary) hypertension: Secondary | ICD-10-CM | POA: Diagnosis not present

## 2023-09-25 DIAGNOSIS — F439 Reaction to severe stress, unspecified: Secondary | ICD-10-CM | POA: Diagnosis not present

## 2023-09-25 DIAGNOSIS — R600 Localized edema: Secondary | ICD-10-CM | POA: Diagnosis not present

## 2023-09-25 DIAGNOSIS — M25571 Pain in right ankle and joints of right foot: Secondary | ICD-10-CM | POA: Diagnosis not present

## 2023-09-25 DIAGNOSIS — I10 Essential (primary) hypertension: Secondary | ICD-10-CM | POA: Diagnosis not present

## 2023-09-26 DIAGNOSIS — M25571 Pain in right ankle and joints of right foot: Secondary | ICD-10-CM | POA: Diagnosis not present

## 2023-09-26 DIAGNOSIS — M19071 Primary osteoarthritis, right ankle and foot: Secondary | ICD-10-CM | POA: Diagnosis not present

## 2023-09-28 ENCOUNTER — Other Ambulatory Visit: Payer: Self-pay | Admitting: Family Medicine

## 2023-09-28 DIAGNOSIS — R600 Localized edema: Secondary | ICD-10-CM

## 2023-09-29 ENCOUNTER — Ambulatory Visit
Admission: RE | Admit: 2023-09-29 | Discharge: 2023-09-29 | Disposition: A | Discharge status: Home / Self Care | Source: Ambulatory Visit | Attending: Family Medicine | Admitting: Family Medicine

## 2023-09-29 DIAGNOSIS — I82441 Acute embolism and thrombosis of right tibial vein: Secondary | ICD-10-CM | POA: Diagnosis not present

## 2023-09-29 DIAGNOSIS — R600 Localized edema: Secondary | ICD-10-CM

## 2023-10-13 DIAGNOSIS — I10 Essential (primary) hypertension: Secondary | ICD-10-CM | POA: Diagnosis not present

## 2023-10-13 DIAGNOSIS — I82401 Acute embolism and thrombosis of unspecified deep veins of right lower extremity: Secondary | ICD-10-CM | POA: Diagnosis not present

## 2023-11-09 ENCOUNTER — Other Ambulatory Visit: Payer: Self-pay | Admitting: Physician Assistant

## 2023-11-16 ENCOUNTER — Telehealth: Payer: Self-pay | Admitting: Physician Assistant

## 2023-11-16 MED ORDER — HYDROCHLOROTHIAZIDE 25 MG PO TABS: 25.0000 mg | ORAL_TABLET | Freq: Every day | ORAL | 0 refills | Status: DC

## 2023-11-16 MED ORDER — POTASSIUM CHLORIDE ER 10 MEQ PO TBCR: 10.0000 meq | EXTENDED_RELEASE_TABLET | Freq: Every day | ORAL | 0 refills | Status: DC

## 2023-11-16 MED ORDER — LOSARTAN POTASSIUM 100 MG PO TABS: 100.0000 mg | ORAL_TABLET | Freq: Every day | ORAL | 0 refills | Status: DC

## 2023-11-16 MED ORDER — CARVEDILOL 25 MG PO TABS: 25.0000 mg | ORAL_TABLET | Freq: Two times a day (BID) | ORAL | 0 refills | Status: DC

## 2023-11-16 NOTE — Telephone Encounter (Signed)
 RX sent in

## 2023-11-16 NOTE — Telephone Encounter (Signed)
*  STAT* If patient is at the pharmacy, call can be transferred to refill team.   1. Which medications need to be refilled? (please list name of each medication and dose if known) losartan  (COZAAR ) 100 MG tablet  hydrochlorothiazide  (HYDRODIURIL ) 25 MG tablet  carvedilol  (COREG ) 25 MG tablet  potassium chloride  (KLOR-CON ) 10 MEQ tablet    2. Would you like to learn more about the convenience, safety, & potential cost savings by using the [facility]?    3. Are you open to using the [facility] (Type [facility]. ).   4. Which pharmacy/location (including street and city if local pharmacy) is medication to be sent to?  [facility] [address] [place], [place] - [address]     5. Do they need a 30 day or 90 day supply? 90 day

## 2023-11-24 NOTE — Progress Notes (Signed)
 Cardiology Office Note:   Date:  11/24/2023  ID:  Sarah Williamson, DOB Jun 07, 1940, MRN [id number] PCP: Seabron Lenis, MD  [facility] Providers Cardiologist:  Aleene Passe, MD (Inactive) { Chief Complaint: No chief complaint on file.     History of Present Illness:   Sarah Williamson is a 83 y.o. female with a PMH of HTN, PE (not on OAC), CKD and prior breast CA who presents for follow up.  Last seen by Dr. Passe 1 year ago.   Past Medical History:  Diagnosis Date   Arthritis    Asymmetric septal hypertrophy (HCC)    WITH MILD LVOT OBSTRUCTION   Cancer (HCC)    Chest pain, unspecified    Heart murmur    sees nasher   History of blood transfusion    due to chemo   History of breast cancer    Hot flashes    Hypertension    Personal history of PE (pulmonary embolism) 2015   PONV (postoperative nausea and vomiting)    Renal insufficiency    TRANSIENT- PROBABLY RESOLVED   Shortness of breath dyspnea    exertion     Studies Reviewed:    EKG: ***       Cardiac Studies & Procedures   ______________________________________________________________________________________________     ECHOCARDIOGRAM  ECHOCARDIOGRAM COMPLETE 04/03/2021  Narrative ECHOCARDIOGRAM REPORT    Patient Name:   Sarah Williamson Date of Exam: 04/03/2021 Medical Rec #:  [id number]     Height:       55.0 in Accession #:    [id number]    Weight:       147.2 lb Date of Birth:  July 29, 1940     BSA:          1.538 m Patient Age:    80 years      BP:           120/70 mmHg Patient Gender: F             HR:           64 bpm. Exam Location:  [address]  Procedure: 2D Echo, Cardiac Doppler and Color Doppler  Indications:    R06.09 SOB  History:        Patient has prior history of Echocardiogram examinations, most recent 05/24/2013. ASH, H/o breast cancer, Signs/Symptoms:Shortness of Breath; Risk Factors:Hypertension.  Sonographer:    Elsie Bohr RDCS Referring Phys: [id number] TESSA N  CONTE  IMPRESSIONS   1. Left ventricular ejection fraction, by estimation, is 60 to 65%. The left ventricle has normal function. The left ventricle has no regional wall motion abnormalities. There is moderate eccentric left ventricular hypertrophy. Left ventricular diastolic parameters are consistent with Grade II diastolic dysfunction (pseudonormalization). Elevated left ventricular end-diastolic pressure. 2. Right ventricular systolic function is normal. The right ventricular size is normal. There is normal pulmonary artery systolic pressure. The estimated right ventricular systolic pressure is 35.5 mmHg. 3. Left atrial size was mildly dilated. 4. The mitral valve is normal in structure. Trivial mitral valve regurgitation. No evidence of mitral stenosis. 5. The aortic valve is tricuspid. There is mild calcification of the aortic valve. Aortic valve regurgitation is mild. Aortic valve sclerosis is present, with no evidence of aortic valve stenosis. 6. The inferior vena cava is normal in size with greater than 50% respiratory variability, suggesting right atrial pressure of 3 mmHg.  Comparison(s): Changes from prior study are noted. LVH now moderate.  Conclusion(s)/Recommendation(s): Elevated LVEDP, grade 2 diastolic  dysfunction.  FINDINGS Left Ventricle: Left ventricular ejection fraction, by estimation, is 60 to 65%. The left ventricle has normal function. The left ventricle has no regional wall motion abnormalities. The left ventricular internal cavity size was normal in size. There is moderate eccentric left ventricular hypertrophy. Left ventricular diastolic parameters are consistent with Grade II diastolic dysfunction (pseudonormalization). Elevated left ventricular end-diastolic pressure. The E/e' is 22.  Right Ventricle: The right ventricular size is normal. Right vetricular wall thickness was not well visualized. Right ventricular systolic function is normal. There is normal pulmonary  artery systolic pressure. The tricuspid regurgitant velocity is 2.85 m/s, and with an assumed right atrial pressure of 3 mmHg, the estimated right ventricular systolic pressure is 35.5 mmHg.  Left Atrium: Left atrial size was mildly dilated.  Right Atrium: Right atrial size was normal in size.  Pericardium: There is no evidence of pericardial effusion.  Mitral Valve: The mitral valve is normal in structure. Trivial mitral valve regurgitation. No evidence of mitral valve stenosis.  Tricuspid Valve: The tricuspid valve is normal in structure. Tricuspid valve regurgitation is trivial. No evidence of tricuspid stenosis.  Aortic Valve: The aortic valve is tricuspid. There is mild calcification of the aortic valve. Aortic valve regurgitation is mild. Aortic regurgitation PHT measures 519 msec. Aortic valve sclerosis is present, with no evidence of aortic valve stenosis.  Pulmonic Valve: The pulmonic valve was not well visualized. Pulmonic valve regurgitation is mild. No evidence of pulmonic stenosis.  Aorta: The aortic root, ascending aorta, aortic arch and descending aorta are all structurally normal, with no evidence of dilitation or obstruction.  Venous: The inferior vena cava is normal in size with greater than 50% respiratory variability, suggesting right atrial pressure of 3 mmHg.  IAS/Shunts: The atrial septum is grossly normal.   LEFT VENTRICLE PLAX 2D LVIDd:         3.20 cm   Diastology LVIDs:         2.30 cm   LV e' medial:    5.77 cm/s LV PW:         1.00 cm   LV E/e' medial:  22.5 LV IVS:        1.50 cm   LV e' lateral:   5.98 cm/s LVOT diam:     1.80 cm   LV E/e' lateral: 21.7 LV SV:         75 LV SV Index:   48 LVOT Area:     2.54 cm   RIGHT VENTRICLE             IVC RV S prime:     12.20 cm/s  IVC diam: 1.60 cm TAPSE (M-mode): 2.5 cm RVSP:           35.5 mmHg  LEFT ATRIUM             Index        RIGHT ATRIUM           Index LA diam:        3.60 cm 2.34 cm/m   RA  Pressure: 3.00 mmHg LA Vol (A2C):   42.1 ml 27.37 ml/m  RA Area:     10.50 cm LA Vol (A4C):   35.3 ml 22.95 ml/m  RA Volume:   20.80 ml  13.52 ml/m LA Biplane Vol: 38.3 ml 24.90 ml/m AORTIC VALVE LVOT Vmax:   111.00 cm/s LVOT Vmean:  80.100 cm/s LVOT VTI:    0.293 m AI PHT:  519 msec  AORTA Ao Root diam: 2.60 cm Ao Asc diam:  2.50 cm  MITRAL VALVE                TRICUSPID VALVE MV Area (PHT): 2.59 cm     TR Peak grad:   32.5 mmHg MV Decel Time: 293 msec     TR Vmax:        285.00 cm/s MV E velocity: 130.00 cm/s  Estimated RAP:  3.00 mmHg MV A velocity: 150.00 cm/s  RVSP:           35.5 mmHg MV E/A ratio:  0.87 SHUNTS Systemic VTI:  0.29 m Systemic Diam: 1.80 cm  Shelda Bruckner MD Electronically signed by Shelda Bruckner MD Signature Date/Time: 04/03/2021/4:24:07 PM    Final          ______________________________________________________________________________________________      Risk Assessment/Calculations:   {Does this patient have ATRIAL FIBRILLATION?[phone]} No BP recorded.  {Refresh Note OR Click here to enter BP  :1}***        Physical Exam:     VS:  There were no vitals taken for this visit. ***    Wt Readings from Last 3 Encounters:  09/30/22 130 lb 12.8 oz (59.3 kg)  10/02/21 141 lb (64 kg)  03/27/21 147 lb 3.2 oz (66.8 kg)     GEN: Well nourished, well developed, in no acute distress NECK: No JVD; No carotid bruits CARDIAC: ***RRR, no murmurs, rubs, gallops RESPIRATORY:  Clear to auscultation without rales, wheezing or rhonchi  ABDOMEN: Soft, non-tender, non-distended, normal bowel sounds EXTREMITIES:  Warm and well perfused, no edema; No deformity, 2+ radial pulses PSYCH: Normal mood and affect   Assessment & Plan       {Are you ordering a CV Procedure (e.g. stress test, cath, DCCV, TEE, etc)?   Press F2        :[id number]}   This note was written with the assistance of a dictation microphone or AI dictation  software. Please excuse any typos or grammatical errors.   Signed, Georganna Archer, MD 11/24/2023 1:26 PM     [facility]

## 2023-11-25 ENCOUNTER — Ambulatory Visit: Attending: Cardiovascular Disease | Admitting: Student in an Organized Health Care Education/Training Program

## 2023-11-25 ENCOUNTER — Encounter: Payer: Self-pay | Admitting: Student in an Organized Health Care Education/Training Program

## 2023-11-25 VITALS — BP 148/70 | HR 60 | Ht <= 58 in | Wt 121.4 lb

## 2023-11-25 DIAGNOSIS — Z13220 Encounter for screening for lipoid disorders: Secondary | ICD-10-CM | POA: Diagnosis not present

## 2023-11-25 DIAGNOSIS — I82501 Chronic embolism and thrombosis of unspecified deep veins of right lower extremity: Secondary | ICD-10-CM | POA: Insufficient documentation

## 2023-11-25 DIAGNOSIS — I358 Other nonrheumatic aortic valve disorders: Secondary | ICD-10-CM | POA: Diagnosis not present

## 2023-11-25 DIAGNOSIS — I10 Essential (primary) hypertension: Secondary | ICD-10-CM

## 2023-11-25 MED ORDER — CARVEDILOL 25 MG PO TABS: 25.0000 mg | ORAL_TABLET | Freq: Two times a day (BID) | ORAL | 3 refills | Status: AC

## 2023-11-25 MED ORDER — HYDROCHLOROTHIAZIDE 25 MG PO TABS: 25.0000 mg | ORAL_TABLET | Freq: Every day | ORAL | 3 refills | Status: AC

## 2023-11-25 MED ORDER — LOSARTAN POTASSIUM 100 MG PO TABS: 100.0000 mg | ORAL_TABLET | Freq: Every day | ORAL | 3 refills | Status: AC

## 2023-11-25 MED ORDER — POTASSIUM CHLORIDE ER 10 MEQ PO TBCR: 10.0000 meq | EXTENDED_RELEASE_TABLET | Freq: Every day | ORAL | 3 refills | Status: AC

## 2023-11-25 NOTE — Assessment & Plan Note (Signed)
 Recently diagnosed RLE DVT.  Currently on Xarelto .  She should continue this indefinitely.  Patient stopped taking her baby aspirin which I am in full agreement with. -Continue Xarelto 

## 2023-11-25 NOTE — Assessment & Plan Note (Signed)
 Blood pressure is elevated in clinic today but the patient shares that she checks her blood pressure regularly at home and is well-controlled.  Likely represents whitecoat hypertension.  She is on a good blood pressure regiment and we will continue for now.  Will check a BMP. - Continue Coreg  25 mg twice daily -Continue HCTZ 25 mg daily -Continue losartan  100 mg daily -BMP today - Follow-up in 12 months

## 2023-11-25 NOTE — Patient Instructions (Signed)
 Medication Instructions:  No medication changes were made at this visit. Continue current regimen.   Refills for Carvedilol , Hydrochlorothiazide , Losartan , and Potassium have been sent to your pharmacy.  *If you need a refill on your cardiac medications before your next appointment, please call your pharmacy*  Lab Work: To be completed today: lipid panel and BMP  If you have labs (blood work) drawn today and your tests are completely normal, you will receive your results only by: MyChart Message (if you have MyChart) OR A paper copy in the mail If you have any lab test that is abnormal or we need to change your treatment, we will call you to review the results.  Testing/Procedures: None ordered today.  Follow-Up: At [facility], you and your health needs are our priority.  As part of our continuing mission to provide you with exceptional heart care, our providers are all part of one team.  This team includes your primary Cardiologist (physician) and Advanced Practice Providers or APPs (Physician Assistants and Nurse Practitioners) who all work together to provide you with the care you need, when you need it.  Your next appointment:   1 year(s)  Provider:   Georganna Archer, MD

## 2023-11-26 ENCOUNTER — Ambulatory Visit: Payer: Self-pay | Admitting: Student in an Organized Health Care Education/Training Program

## 2023-11-26 LAB — LIPID PANEL
Chol/HDL Ratio: 1.8 ratio (ref 0.0–4.4)
Cholesterol, Total: 103 mg/dL (ref 100–199)
HDL: 57 mg/dL (ref >39)
LDL Chol Calc (NIH): 32 mg/dL (ref 0–99)
Triglycerides: 64 mg/dL (ref 0–149)
VLDL Cholesterol Cal: 14 mg/dL (ref 5–40)

## 2023-11-26 LAB — BASIC METABOLIC PANEL WITH GFR
BUN/Creatinine Ratio: 30 — ABNORMAL HIGH (ref 12–28)
BUN: 33 mg/dL — ABNORMAL HIGH (ref 8–27)
CO2: 21 mmol/L (ref 20–29)
Calcium: 9.9 mg/dL (ref 8.7–10.3)
Chloride: 104 mmol/L (ref 96–106)
Creatinine, Ser: 1.11 mg/dL — ABNORMAL HIGH (ref 0.57–1.00)
Glucose: 98 mg/dL (ref 70–99)
Potassium: 4.9 mmol/L (ref 3.5–5.2)
Sodium: 141 mmol/L (ref 134–144)
eGFR: 50 mL/min/1.73 — ABNORMAL LOW (ref >59)

## 2023-12-10 DIAGNOSIS — Z1331 Encounter for screening for depression: Secondary | ICD-10-CM | POA: Diagnosis not present

## 2023-12-10 DIAGNOSIS — M1990 Unspecified osteoarthritis, unspecified site: Secondary | ICD-10-CM | POA: Diagnosis not present

## 2023-12-10 DIAGNOSIS — E782 Mixed hyperlipidemia: Secondary | ICD-10-CM | POA: Diagnosis not present

## 2023-12-10 DIAGNOSIS — Z853 Personal history of malignant neoplasm of breast: Secondary | ICD-10-CM | POA: Diagnosis not present

## 2023-12-10 DIAGNOSIS — N1831 Chronic kidney disease, stage 3a: Secondary | ICD-10-CM | POA: Diagnosis not present

## 2023-12-10 DIAGNOSIS — Z86718 Personal history of other venous thrombosis and embolism: Secondary | ICD-10-CM | POA: Diagnosis not present

## 2023-12-10 DIAGNOSIS — R7303 Prediabetes: Secondary | ICD-10-CM | POA: Diagnosis not present

## 2023-12-10 DIAGNOSIS — K219 Gastro-esophageal reflux disease without esophagitis: Secondary | ICD-10-CM | POA: Diagnosis not present

## 2023-12-10 DIAGNOSIS — M10079 Idiopathic gout, unspecified ankle and foot: Secondary | ICD-10-CM | POA: Diagnosis not present

## 2023-12-10 DIAGNOSIS — E559 Vitamin D deficiency, unspecified: Secondary | ICD-10-CM | POA: Diagnosis not present

## 2023-12-10 DIAGNOSIS — R790 Abnormal level of blood mineral: Secondary | ICD-10-CM | POA: Diagnosis not present

## 2023-12-10 DIAGNOSIS — Z0000 Encounter for general adult medical examination without abnormal findings: Secondary | ICD-10-CM | POA: Diagnosis not present

## 2023-12-10 DIAGNOSIS — I10 Essential (primary) hypertension: Secondary | ICD-10-CM | POA: Diagnosis not present

## 2023-12-17 ENCOUNTER — Telehealth (HOSPITAL_BASED_OUTPATIENT_CLINIC_OR_DEPARTMENT_OTHER): Payer: Self-pay

## 2023-12-17 ENCOUNTER — Other Ambulatory Visit (HOSPITAL_BASED_OUTPATIENT_CLINIC_OR_DEPARTMENT_OTHER): Payer: Self-pay | Admitting: Family Medicine

## 2023-12-17 DIAGNOSIS — M85832 Other specified disorders of bone density and structure, left forearm: Secondary | ICD-10-CM

## 2023-12-24 ENCOUNTER — Ambulatory Visit (HOSPITAL_BASED_OUTPATIENT_CLINIC_OR_DEPARTMENT_OTHER)
Admission: RE | Admit: 2023-12-24 | Discharge: 2023-12-24 | Disposition: A | Discharge status: Home / Self Care | Source: Ambulatory Visit | Attending: Family Medicine | Admitting: Family Medicine

## 2023-12-24 DIAGNOSIS — M85832 Other specified disorders of bone density and structure, left forearm: Secondary | ICD-10-CM | POA: Diagnosis not present

## 2023-12-24 DIAGNOSIS — Z780 Asymptomatic menopausal state: Secondary | ICD-10-CM | POA: Diagnosis not present

## 2024-01-05 DIAGNOSIS — I890 Lymphedema, not elsewhere classified: Secondary | ICD-10-CM | POA: Diagnosis not present

## 2024-01-05 DIAGNOSIS — M79661 Pain in right lower leg: Secondary | ICD-10-CM | POA: Diagnosis not present

## 2024-01-05 DIAGNOSIS — M79604 Pain in right leg: Secondary | ICD-10-CM | POA: Diagnosis not present

## 2024-01-05 DIAGNOSIS — M79662 Pain in left lower leg: Secondary | ICD-10-CM | POA: Diagnosis not present

## 2024-01-05 DIAGNOSIS — I87393 Chronic venous hypertension (idiopathic) with other complications of bilateral lower extremity: Secondary | ICD-10-CM | POA: Diagnosis not present
